# Patient Record
Sex: Female | Born: 1955 | Race: White | Hispanic: No | Marital: Single | Smoking: Current every day smoker
Health system: Southern US, Community
[De-identification: ages and names within clinical notes are randomized; demographics above are authoritative.]

## PROBLEM LIST (undated history)

## (undated) DIAGNOSIS — S069XAA Unspecified intracranial injury with loss of consciousness status unknown, initial encounter: Secondary | ICD-10-CM

## (undated) DIAGNOSIS — S069X9A Unspecified intracranial injury with loss of consciousness of unspecified duration, initial encounter: Secondary | ICD-10-CM

## (undated) DIAGNOSIS — F32A Depression, unspecified: Secondary | ICD-10-CM

## (undated) DIAGNOSIS — F419 Anxiety disorder, unspecified: Secondary | ICD-10-CM

## (undated) DIAGNOSIS — I10 Essential (primary) hypertension: Secondary | ICD-10-CM

## (undated) DIAGNOSIS — H9191 Unspecified hearing loss, right ear: Secondary | ICD-10-CM

## (undated) DIAGNOSIS — H5440 Blindness, one eye, unspecified eye: Secondary | ICD-10-CM

## (undated) DIAGNOSIS — F329 Major depressive disorder, single episode, unspecified: Secondary | ICD-10-CM

---

## 2015-05-11 ENCOUNTER — Emergency Department (HOSPITAL_BASED_OUTPATIENT_CLINIC_OR_DEPARTMENT_OTHER): Payer: 59

## 2015-05-11 ENCOUNTER — Encounter (HOSPITAL_BASED_OUTPATIENT_CLINIC_OR_DEPARTMENT_OTHER): Payer: Self-pay | Admitting: *Deleted

## 2015-05-11 ENCOUNTER — Inpatient Hospital Stay (HOSPITAL_BASED_OUTPATIENT_CLINIC_OR_DEPARTMENT_OTHER)
Admission: EM | Admit: 2015-05-11 | Discharge: 2015-05-15 | DRG: 357 | Disposition: A | Discharge status: Home / Self Care | Payer: 59 | Attending: Internal Medicine | Admitting: Internal Medicine

## 2015-05-11 DIAGNOSIS — R1013 Epigastric pain: Secondary | ICD-10-CM | POA: Diagnosis present

## 2015-05-11 DIAGNOSIS — F419 Anxiety disorder, unspecified: Secondary | ICD-10-CM | POA: Diagnosis present

## 2015-05-11 DIAGNOSIS — F1010 Alcohol abuse, uncomplicated: Secondary | ICD-10-CM | POA: Diagnosis present

## 2015-05-11 DIAGNOSIS — E876 Hypokalemia: Secondary | ICD-10-CM | POA: Diagnosis present

## 2015-05-11 DIAGNOSIS — F17210 Nicotine dependence, cigarettes, uncomplicated: Secondary | ICD-10-CM | POA: Diagnosis present

## 2015-05-11 DIAGNOSIS — Z87820 Personal history of traumatic brain injury: Secondary | ICD-10-CM | POA: Diagnosis not present

## 2015-05-11 DIAGNOSIS — R197 Diarrhea, unspecified: Secondary | ICD-10-CM

## 2015-05-11 DIAGNOSIS — K5790 Diverticulosis of intestine, part unspecified, without perforation or abscess without bleeding: Secondary | ICD-10-CM | POA: Diagnosis present

## 2015-05-11 DIAGNOSIS — E860 Dehydration: Secondary | ICD-10-CM | POA: Insufficient documentation

## 2015-05-11 DIAGNOSIS — F329 Major depressive disorder, single episode, unspecified: Secondary | ICD-10-CM | POA: Diagnosis present

## 2015-05-11 DIAGNOSIS — I10 Essential (primary) hypertension: Secondary | ICD-10-CM | POA: Diagnosis present

## 2015-05-11 DIAGNOSIS — F10239 Alcohol dependence with withdrawal, unspecified: Secondary | ICD-10-CM | POA: Diagnosis present

## 2015-05-11 DIAGNOSIS — F4323 Adjustment disorder with mixed anxiety and depressed mood: Secondary | ICD-10-CM | POA: Diagnosis present

## 2015-05-11 DIAGNOSIS — H5441 Blindness, right eye, normal vision left eye: Secondary | ICD-10-CM | POA: Diagnosis present

## 2015-05-11 DIAGNOSIS — N390 Urinary tract infection, site not specified: Secondary | ICD-10-CM | POA: Diagnosis present

## 2015-05-11 DIAGNOSIS — R000 Tachycardia, unspecified: Secondary | ICD-10-CM | POA: Diagnosis present

## 2015-05-11 DIAGNOSIS — H9191 Unspecified hearing loss, right ear: Secondary | ICD-10-CM | POA: Diagnosis present

## 2015-05-11 DIAGNOSIS — R1110 Vomiting, unspecified: Secondary | ICD-10-CM | POA: Diagnosis not present

## 2015-05-11 DIAGNOSIS — K561 Intussusception: Secondary | ICD-10-CM | POA: Diagnosis present

## 2015-05-11 DIAGNOSIS — F1910 Other psychoactive substance abuse, uncomplicated: Secondary | ICD-10-CM | POA: Diagnosis present

## 2015-05-11 HISTORY — DX: Unspecified intracranial injury with loss of consciousness status unknown, initial encounter: S06.9XAA

## 2015-05-11 HISTORY — DX: Unspecified intracranial injury with loss of consciousness of unspecified duration, initial encounter: S06.9X9A

## 2015-05-11 HISTORY — DX: Anxiety disorder, unspecified: F41.9

## 2015-05-11 HISTORY — DX: Unspecified hearing loss, right ear: H91.91

## 2015-05-11 HISTORY — DX: Depression, unspecified: F32.A

## 2015-05-11 HISTORY — DX: Essential (primary) hypertension: I10

## 2015-05-11 HISTORY — DX: Blindness, one eye, unspecified eye: H54.40

## 2015-05-11 HISTORY — DX: Major depressive disorder, single episode, unspecified: F32.9

## 2015-05-11 LAB — COMPREHENSIVE METABOLIC PANEL
ALT: 31 U/L (ref 14–54)
AST: 47 U/L — ABNORMAL HIGH (ref 15–41)
Albumin: 5.1 g/dL — ABNORMAL HIGH (ref 3.5–5.0)
Alkaline Phosphatase: 89 U/L (ref 38–126)
Anion gap: 16 — ABNORMAL HIGH (ref 5–15)
BUN: 26 mg/dL — ABNORMAL HIGH (ref 6–20)
CHLORIDE: 101 mmol/L (ref 101–111)
CO2: 22 mmol/L (ref 22–32)
CREATININE: 0.78 mg/dL (ref 0.44–1.00)
Calcium: 9.6 mg/dL (ref 8.9–10.3)
Glucose, Bld: 163 mg/dL — ABNORMAL HIGH (ref 65–99)
Potassium: 2.8 mmol/L — ABNORMAL LOW (ref 3.5–5.1)
Sodium: 139 mmol/L (ref 135–145)
TOTAL PROTEIN: 8.3 g/dL — AB (ref 6.5–8.1)
Total Bilirubin: 1.3 mg/dL — ABNORMAL HIGH (ref 0.3–1.2)

## 2015-05-11 LAB — URINALYSIS, ROUTINE W REFLEX MICROSCOPIC
Glucose, UA: 100 mg/dL — AB
Ketones, ur: >80 mg/dL — AB
NITRITE: NEGATIVE
Protein, ur: >300 mg/dL — AB
SPECIFIC GRAVITY, URINE: 1.022 (ref 1.005–1.030)
pH: 6 (ref 5.0–8.0)

## 2015-05-11 LAB — CBC WITH DIFFERENTIAL/PLATELET
Basophils Absolute: 0 10*3/uL (ref 0.0–0.1)
Basophils Relative: 0 %
EOS ABS: 0 10*3/uL (ref 0.0–0.7)
EOS PCT: 0 %
HCT: 51.5 % — ABNORMAL HIGH (ref 36.0–46.0)
HEMOGLOBIN: 17.7 g/dL — AB (ref 12.0–15.0)
LYMPHS ABS: 0.5 10*3/uL — AB (ref 0.7–4.0)
Lymphocytes Relative: 3 %
MCH: 31 pg (ref 26.0–34.0)
MCHC: 34.4 g/dL (ref 30.0–36.0)
MCV: 90.2 fL (ref 78.0–100.0)
MONOS PCT: 8 %
Monocytes Absolute: 1.6 10*3/uL — ABNORMAL HIGH (ref 0.1–1.0)
Neutro Abs: 16.8 10*3/uL — ABNORMAL HIGH (ref 1.7–7.7)
Neutrophils Relative %: 89 %
PLATELETS: 323 10*3/uL (ref 150–400)
RBC: 5.71 MIL/uL — ABNORMAL HIGH (ref 3.87–5.11)
RDW: 14.3 % (ref 11.5–15.5)
WBC: 18.8 10*3/uL — ABNORMAL HIGH (ref 4.0–10.5)

## 2015-05-11 LAB — URINE MICROSCOPIC-ADD ON

## 2015-05-11 LAB — RAPID URINE DRUG SCREEN, HOSP PERFORMED
AMPHETAMINES: NOT DETECTED
BARBITURATES: NOT DETECTED
BENZODIAZEPINES: POSITIVE — AB
COCAINE: NOT DETECTED
Opiates: NOT DETECTED
TETRAHYDROCANNABINOL: POSITIVE — AB

## 2015-05-11 LAB — LIPASE, BLOOD: LIPASE: 25 U/L (ref 11–51)

## 2015-05-11 LAB — ETHANOL: Alcohol, Ethyl (B): <5 mg/dL (ref <5)

## 2015-05-11 MED ORDER — SODIUM CHLORIDE 0.9 % IV SOLN
1000.0000 mL | INTRAVENOUS | Status: DC
  Administered 2015-05-11 – 2015-05-12 (×3): 1000 mL via INTRAVENOUS

## 2015-05-11 MED ORDER — SODIUM CHLORIDE 0.9 % IV SOLN
1000.0000 mL | Freq: Once | INTRAVENOUS | Status: AC
  Administered 2015-05-11: 1000 mL via INTRAVENOUS

## 2015-05-11 MED ORDER — FAMOTIDINE 20 MG PO TABS: 20.0000 mg | ORAL_TABLET | Freq: Once | ORAL | Status: DC

## 2015-05-11 MED ORDER — SODIUM CHLORIDE 0.9 % IV BOLUS (SEPSIS)
1000.0000 mL | Freq: Once | INTRAVENOUS | Status: AC
  Administered 2015-05-11: 1000 mL via INTRAVENOUS

## 2015-05-11 MED ORDER — IOHEXOL 300 MG/ML  SOLN
100.0000 mL | Freq: Once | INTRAMUSCULAR | Status: AC | PRN
  Administered 2015-05-11: 100 mL via INTRAVENOUS

## 2015-05-11 MED ORDER — IOHEXOL 300 MG/ML  SOLN
25.0000 mL | Freq: Once | INTRAMUSCULAR | Status: AC | PRN
  Administered 2015-05-11: 25 mL via ORAL

## 2015-05-11 MED ORDER — POTASSIUM CHLORIDE CRYS ER 20 MEQ PO TBCR
40.0000 meq | EXTENDED_RELEASE_TABLET | Freq: Once | ORAL | Status: AC
  Administered 2015-05-11: 40 meq via ORAL
  Filled 2015-05-11: qty 2

## 2015-05-11 MED ORDER — PROMETHAZINE HCL 25 MG/ML IJ SOLN
25.0000 mg | Freq: Once | INTRAMUSCULAR | Status: AC
  Administered 2015-05-11: 25 mg via INTRAVENOUS
  Filled 2015-05-11: qty 1

## 2015-05-11 MED ORDER — POTASSIUM CHLORIDE 10 MEQ/100ML IV SOLN
10.0000 meq | Freq: Once | INTRAVENOUS | Status: AC
  Administered 2015-05-11: 10 meq via INTRAVENOUS
  Filled 2015-05-11: qty 100

## 2015-05-11 MED ORDER — PIPERACILLIN-TAZOBACTAM 3.375 G IVPB 30 MIN
3.3750 g | Freq: Once | INTRAVENOUS | Status: AC
  Administered 2015-05-11: 3.375 g via INTRAVENOUS
  Filled 2015-05-11 (×2): qty 50

## 2015-05-11 MED ORDER — PANTOPRAZOLE SODIUM 40 MG IV SOLR
40.0000 mg | Freq: Once | INTRAVENOUS | Status: AC
  Administered 2015-05-11: 40 mg via INTRAVENOUS
  Filled 2015-05-11: qty 40

## 2015-05-11 MED ORDER — LORAZEPAM 2 MG/ML IJ SOLN
2.0000 mg | Freq: Once | INTRAMUSCULAR | Status: AC
Start: 2015-05-11 — End: 2015-05-11
  Administered 2015-05-11: 2 mg via INTRAVENOUS
  Filled 2015-05-11: qty 1

## 2015-05-11 MED ORDER — FAMOTIDINE IN NACL 20-0.9 MG/50ML-% IV SOLN
20.0000 mg | Freq: Once | INTRAVENOUS | Status: AC
  Administered 2015-05-11: 20 mg via INTRAVENOUS
  Filled 2015-05-11: qty 50

## 2015-05-11 MED ORDER — ONDANSETRON HCL 4 MG/2ML IJ SOLN
4.0000 mg | Freq: Once | INTRAMUSCULAR | Status: AC
  Administered 2015-05-11: 4 mg via INTRAVENOUS
  Filled 2015-05-11: qty 2

## 2015-05-11 NOTE — ED Notes (Signed)
Pt's son, Melanee Spryan, [phone]

## 2015-05-11 NOTE — ED Notes (Signed)
Pt returned from CT.  Pt is still confused, asking "how did the surgery go."  Reoriented pt and told her she was just at CT.

## 2015-05-11 NOTE — ED Notes (Signed)
Pt's son is at the bedside.  Asked if pt was normally confused, he says that this is her normal state.  She is also normally very restless per patient.  Pt was able to walk to bathroom with little assistance this time as well.

## 2015-05-11 NOTE — ED Notes (Signed)
Pt ambulated to restroom, slightly unsteady on her feet

## 2015-05-11 NOTE — ED Notes (Signed)
Son called to check on pt and nurse asked if pt was usually confused.  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  He states that she is just normally not so weak and that she has been vomiting and not eating regularly recently.

## 2015-05-11 NOTE — ED Notes (Signed)
Pt placed on cardiac monitor 

## 2015-05-11 NOTE — ED Notes (Signed)
Pt caught attempting to hide the potassium pills in her mattress and not take them.  When asked why, she states "because they are too big."  Nurse sitting with pt to be sure she takes her pills and drinks all her contrast.

## 2015-05-11 NOTE — ED Notes (Signed)
Patient transported to CT 

## 2015-05-11 NOTE — ED Provider Notes (Signed)
CSN: [id number]     Arrival date & time 05/11/15  1525 History  By signing my name below, I, Doreatha Martin, attest that this documentation has been prepared under the direction and in the presence of Arby Barrette, MD. Electronically Signed: Doreatha Martin, ED Scribe. 05/11/2015. 6:29 PM.    Chief Complaint  Patient presents with  . Abdominal Pain   The history is provided by the patient. No language interpreter was used.    HPI Comments: Billiejean Schimek is a 59 y.o. female with h/o HTN who presents to the Emergency Department complaining of moderate, intermittent vomiting x4 and loose diarrhea x5 onset yesterday at 4PM with associated epigastric abdominal pain, nausea. Pt also reports that she has some cold symptoms consistent with her usual seasonal allergies. She notes that she frequently has similar symptoms and sees her PCP for treatment. Pt reports that she drinks a few beers a week. No h/o cholecystitis, pancreatitis, cholecystectomy, pancreectomy. No daily medications. She denies fever, leg swelling, back pain.   Past Medical History  Diagnosis Date  . Depressed   . Hypertension   . Anxiety   . TBI (traumatic brain injury) (HCC)   . Deafness in right ear   . Blind right eye    History reviewed. No pertinent past surgical history. History reviewed. No pertinent family history. Social History  Substance Use Topics  . Smoking status: Current Every Day Smoker -- 1.00 packs/day    Types: Cigarettes  . Smokeless tobacco: None  . Alcohol Use: No   OB History    No data available     Review of Systems 10 Systems reviewed and are negative for acute change except as noted in the HPI.   Allergies  Zithromax  Home Medications   Prior to Admission medications   Medication Sig Start Date End Date Taking? Authorizing Provider  ALPRAZolam Prudy Feeler) 1 MG tablet Take 1 mg by mouth at bedtime as needed for anxiety.   Yes Historical Provider, MD  hydrochlorothiazide (HYDRODIURIL) 25 MG  tablet Take 25 mg by mouth daily.   Yes Historical Provider, MD  sertraline (ZOLOFT) 100 MG tablet Take 150 mg by mouth daily.   Yes Historical Provider, MD   BP 132/101 mmHg  Pulse 91  Temp(Src) 99.6 F (37.6 C) (Oral)  Resp 18  Ht  (1.651 m)  Wt 150 lb (68.04 kg)  BMI 24.96 kg/m2  SpO2 94% Physical Exam  Constitutional: She appears well-developed and well-nourished.  Patient is agitated and uncomfortable in appearance. No respiratory distress.  HENT:  Head: Normocephalic and atraumatic.  Mouth/Throat: Oropharynx is clear and moist.  Eyes: Conjunctivae and EOM are normal. Pupils are equal, round, and reactive to light.  Neck: Normal range of motion. Neck supple.  Cardiovascular: Normal rate, regular rhythm, normal heart sounds and intact distal pulses.   Pulmonary/Chest: Effort normal and breath sounds normal. No respiratory distress. She has no wheezes. She has no rales.  Abdominal: Soft. She exhibits no distension. There is tenderness.  Epigastric pain to palpation.  Musculoskeletal: Normal range of motion. She exhibits no edema or tenderness.  Neurological: She is alert. No cranial nerve deficit. Coordination normal.  Skin: Skin is warm and dry.  Psychiatric:  Patient is moderately agitated.  Nursing note and vitals reviewed.   ED Course  Procedures (including critical care time) DIAGNOSTIC STUDIES: Oxygen Saturation is 99% on RA, normal by my interpretation.    COORDINATION OF CARE: 6:27 PM Discussed treatment plan with pt at bedside  which includes lab work and pt agreed to plan.   Labs Review Labs Reviewed  URINALYSIS, ROUTINE W REFLEX MICROSCOPIC (NOT AT [facility]) - Abnormal; Notable for the following:    APPearance CLOUDY (*)    Glucose, UA 100 (*)    Hgb urine dipstick LARGE (*)    Bilirubin Urine SMALL (*)    Ketones, ur >80 (*)    Protein, ur >300 (*)    Leukocytes, UA SMALL (*)    All other components within normal limits  CBC WITH DIFFERENTIAL/PLATELET  - Abnormal; Notable for the following:    WBC 18.8 (*)    RBC 5.71 (*)    Hemoglobin 17.7 (*)    HCT 51.5 (*)    Neutro Abs 16.8 (*)    Lymphs Abs 0.5 (*)    Monocytes Absolute 1.6 (*)    All other components within normal limits  COMPREHENSIVE METABOLIC PANEL - Abnormal; Notable for the following:    Potassium 2.8 (*)    Glucose, Bld 163 (*)    BUN 26 (*)    Total Protein 8.3 (*)    Albumin 5.1 (*)    AST 47 (*)    Total Bilirubin 1.3 (*)    Anion gap 16 (*)    All other components within normal limits  URINE MICROSCOPIC-ADD ON - Abnormal; Notable for the following:    Squamous Epithelial / LPF 0-5 (*)    Bacteria, UA MANY (*)    Casts HYALINE CASTS (*)    All other components within normal limits  URINE RAPID DRUG SCREEN, HOSP PERFORMED - Abnormal; Notable for the following:    Benzodiazepines POSITIVE (*)    Tetrahydrocannabinol POSITIVE (*)    All other components within normal limits  LIPASE, BLOOD  ETHANOL   Imaging Review Ct Head Wo Contrast  05/11/2015  CLINICAL DATA:  Confusion and weakness EXAM: CT HEAD WITHOUT CONTRAST TECHNIQUE: Contiguous axial images were obtained from the base of the skull through the vertex without intravenous contrast. COMPARISON:  None. FINDINGS: The examination is significantly limited by patient motion artifact. The bony calvarium appears intact. No soft tissue changes are noted. No findings to suggest acute hemorrhage, acute infarction or space-occupying mass lesion are noted. IMPRESSION: Somewhat limited examination although no acute abnormality is noted. Electronically Signed   By: Alcide CleverMark  Lukens M.D.   On: 05/11/2015 22:28   Ct Abdomen Pelvis W Contrast  05/11/2015  CLINICAL DATA:  59 year old female with nausea vomiting and periumbilical pain. EXAM: CT ABDOMEN AND PELVIS WITH CONTRAST TECHNIQUE: Multidetector CT imaging of the abdomen and pelvis was performed using the standard protocol following bolus administration of intravenous  contrast. CONTRAST:  25mL OMNIPAQUE IOHEXOL 300 MG/ML SOLN, 100mL OMNIPAQUE IOHEXOL 300 MG/ML SOLN COMPARISON:  None. FINDINGS: Minimal bibasilar dependent atelectatic changes. The lung bases are otherwise clear. No intra-abdominal free air or free fluid. The liver, gallbladder, pancreas, and spleen appear unremarkable. There are bilateral adrenal thickening and nodularity measuring up to 1.6 cm on the right. MRI may provide better evaluation. 1 cm bilateral renal hypodense lesions are incompletely characterized but likely represent. There is no hydronephrosis on either side. The visualized ureters and urinary bladder appear unremarkable. The uterus and the ovaries appear grossly unremarkable. There is approximately cm telescoping of a loop of small bowel within the adjacent small bowel loop in the left lower abdomen compatible with intussusception. Although this may be a transient intussusception the length of the intussusception is concerning for an underlying lead point.  Clinical correlation and follow-up with MR enterography recommended. There is mild thickening and edema of the bowel at the intussusception. No pneumatosis identified. There is extensive sigmoid diverticulosis with muscular hypertrophy. Minimal perisigmoid haziness likely represents chronic inflammation and scarring. A mild/early sigmoid diverticulitis is less likely but not excluded. Clinical correlation is recommended. There is no evidence of bowel obstruction. Normal appendix. A small hiatal hernia is noted. There is a 2.7 cm duodenal diverticulum. There is mild aortoiliac atherosclerotic disease. The abdominal aorta and IVC appear patent. The origins of the celiac axis, SMA, IMA as well as the origins of the renal arteries are patent. There is a retro aortic left renal vein. No portal venous gas identified. There is no adenopathy. The abdominal wall soft tissues appear unremarkable. There is degenerative changes of the spine. Bilateral L5  pars defects with grade 1 L5-S1 anterolisthesis. No acute fracture. IMPRESSION: A 6 cm small bowel intussusception in the left hemiabdomen with mild edema of the bowel. No pneumatosis or evidence of obstruction. Follow-up with MR enterography recommended to exclude underlying lead point. Extensive sigmoid diverticulosis with muscular hypertrophy. A mild/early diverticulitis is less likely. Clinical correlation is recommended. No evidence of bowel obstruction. Normal appendix. Electronically Signed   By: Elgie Collard M.D.   On: 05/11/2015 22:21    I have personally reviewed and evaluated these images and lab results as part of my medical decision-making. Consult: Dr. Maisie Fus general surgery. She advises that this time that intussusception is not an acute surgical issue if there are no signs of obstruction. This can happen with gastroenteritis. Patient can be admitted to hospitalist with consultation with surgery if signs obstruction develop Consult the hospitalist. Patient be admitted for hydration, pain control with observation for any signs of obstruction or surgical abdomen. MDM   Final diagnoses:  Vomiting and diarrhea  Dehydration  Epigastric pain  Intussusception [facility])   Patient presented with epigastric pain, vomiting and diarrhea. First consideration was for gastroenteritis however patient also complained of significant epigastric pain with leukocytosis. CT scan was obtained and intussusception was identified. This was reviewed with general surgery as outlined. No emergent surgical intervention required as discussed with Dr. Maisie Fus. Patient showed signs of dehydration with hemoconcentration. Patient's demeanor has a degree of agitation and guardedness suggestive of possible substance or psychiatric component. Per the patient's son this is a baseline for her. Patient reported history of traumatic brain injury, distant. I question whether or not the patient had significant alcohol consumption.  She did endorse weekly use but did not endorse heavy regular use or history of withdrawal. Patient was empirically given Ativan. Drug screen is also positive for benzodiazepine and marijuana. Upon recheck the patient appears much improved. She is now relaxed. She is alert and appropriate. She will be admitted for ongoing treatment of her hypokalemia with vomiting and diarrhea and observation for the identified intussusception.  Arby Barrette, MD 05/11/15 [id number]

## 2015-05-11 NOTE — Progress Notes (Signed)
[facility] Dr. Donnald GarrePfeiffer  59 year old lady with past medical history of traumatic brain injury with right ear deaf and right eye blindness, hypertension, depression, anxiety, drug abuse, who presents with nausea, vomiting and abdominal pain for 2 days. CT abdomen/pelvis showed 6 cm of small bowel intussusception and diverticulosis.   In emergency room, patient was found to have WBC 18.8, temperature 99.6, mild tachycardia, potassium 2.8, renal functioning okay, positive urinalysis with small amount of leukocyte, alcohol level less than 5, lipase 25, positive UDS for THC and benzos, abnormal liver function with AST 47, ALT 31, total bilirubin 1.3 and ALP 89. CT head is negative for acute temporal (though the study was limited). EDP discussed with general surgeon, Dr. Maisie Fushomas who did not think patient needs surgery. Pt is accepted to tele bed.  Ana HarpXilin Aymara Sassi, MD  [facility] Pager [phone]  If 7PM-7AM, please contact night-coverage [email or web address] Password [email or web address] 05/11/2015, 11:36 PM

## 2015-05-11 NOTE — ED Notes (Signed)
Pt c/o adb pain n/v/d x 2 days

## 2015-05-12 ENCOUNTER — Inpatient Hospital Stay (HOSPITAL_COMMUNITY): Payer: 59

## 2015-05-12 LAB — CBC
HEMATOCRIT: 48 % — AB (ref 36.0–46.0)
Hemoglobin: 16.4 g/dL — ABNORMAL HIGH (ref 12.0–15.0)
MCH: 31.7 pg (ref 26.0–34.0)
MCHC: 34.2 g/dL (ref 30.0–36.0)
MCV: 92.8 fL (ref 78.0–100.0)
Platelets: 275 10*3/uL (ref 150–400)
RBC: 5.17 MIL/uL — ABNORMAL HIGH (ref 3.87–5.11)
RDW: 14.3 % (ref 11.5–15.5)
WBC: 11.3 10*3/uL — AB (ref 4.0–10.5)

## 2015-05-12 LAB — BASIC METABOLIC PANEL
ANION GAP: 13 (ref 5–15)
BUN: 11 mg/dL (ref 6–20)
CHLORIDE: 105 mmol/L (ref 101–111)
CO2: 20 mmol/L — AB (ref 22–32)
Calcium: 8.8 mg/dL — ABNORMAL LOW (ref 8.9–10.3)
Creatinine, Ser: 0.67 mg/dL (ref 0.44–1.00)
GFR calc Af Amer: >60 mL/min (ref >60)
GFR calc non Af Amer: >60 mL/min (ref >60)
GLUCOSE: 109 mg/dL — AB (ref 65–99)
POTASSIUM: 3.4 mmol/L — AB (ref 3.5–5.1)
Sodium: 138 mmol/L (ref 135–145)

## 2015-05-12 LAB — MAGNESIUM: Magnesium: 1.9 mg/dL (ref 1.7–2.4)

## 2015-05-12 MED ORDER — SODIUM CHLORIDE 0.9 % IV SOLN
INTRAVENOUS | Status: AC
  Administered 2015-05-12: 02:00:00 via INTRAVENOUS

## 2015-05-12 MED ORDER — ALPRAZOLAM 0.5 MG PO TABS
1.0000 mg | ORAL_TABLET | Freq: Once | ORAL | Status: AC | PRN
  Administered 2015-05-12: 1 mg via ORAL
  Filled 2015-05-12: qty 2

## 2015-05-12 MED ORDER — WHITE PETROLATUM GEL
Status: AC
  Administered 2015-05-12: 23:00:00
  Filled 2015-05-12: qty 1

## 2015-05-12 MED ORDER — POTASSIUM CHLORIDE 10 MEQ/100ML IV SOLN
10.0000 meq | Freq: Once | INTRAVENOUS | Status: AC
  Administered 2015-05-12: 10 meq via INTRAVENOUS
  Filled 2015-05-12: qty 100

## 2015-05-12 MED ORDER — SODIUM CHLORIDE 0.9 % IV SOLN
1000.0000 mL | INTRAVENOUS | Status: DC
  Administered 2015-05-13: 1000 mL via INTRAVENOUS

## 2015-05-12 MED ORDER — BARIUM SULFATE 0.1 % PO SUSP
ORAL | Status: AC
  Filled 2015-05-12: qty 3

## 2015-05-12 MED ORDER — SERTRALINE HCL 50 MG PO TABS
150.0000 mg | ORAL_TABLET | Freq: Every day | ORAL | Status: DC
  Administered 2015-05-12 – 2015-05-13 (×2): 150 mg via ORAL
  Filled 2015-05-12 (×3): qty 1

## 2015-05-12 MED ORDER — GADOBENATE DIMEGLUMINE 529 MG/ML IV SOLN
15.0000 mL | Freq: Once | INTRAVENOUS | Status: AC | PRN
Start: 2015-05-12 — End: 2015-05-12
  Administered 2015-05-12: 15 mL via INTRAVENOUS

## 2015-05-12 MED ORDER — ALPRAZOLAM 0.5 MG PO TABS
1.0000 mg | ORAL_TABLET | Freq: Every evening | ORAL | Status: DC | PRN
  Administered 2015-05-12 – 2015-05-14 (×3): 1 mg via ORAL
  Filled 2015-05-12 (×3): qty 2

## 2015-05-12 MED ORDER — PROMETHAZINE HCL 25 MG PO TABS
12.5000 mg | ORAL_TABLET | Freq: Four times a day (QID) | ORAL | Status: DC | PRN
  Administered 2015-05-12 – 2015-05-15 (×9): 12.5 mg via ORAL
  Filled 2015-05-12 (×9): qty 1

## 2015-05-12 MED ORDER — HEPARIN SODIUM (PORCINE) 5000 UNIT/ML IJ SOLN
5000.0000 [IU] | Freq: Three times a day (TID) | INTRAMUSCULAR | Status: DC
  Administered 2015-05-12 – 2015-05-15 (×7): 5000 [IU] via SUBCUTANEOUS
  Filled 2015-05-12 (×5): qty 1

## 2015-05-12 NOTE — H&P (Signed)
[facility] History and Physical  Ana SchwartzKathy Kent NWG:[id number] DOB: 08/24/1955 DOA: 05/11/2015  Referring physician: EDP PCP: No primary care provider on file.   Chief Complaint: Abdominal pain   HPI: Ana Stone is a 59 y.o. female who presents to the ED with c/o moderate, intermittent vomiting x4 episodes and diarrhea x5 episodes onset yesterday at 4pm.  Patient also reports that she has similar symptoms to above.  She has tried nothing for these symptoms and [facility]  Patient was demonstrating severe signs of agitation in the ED which improved with 1mg  ativan.  Although the EDP was more suspicious of a substance or psychatric component to her presentation, her CT abd/pelvis actually came back showing 6cm intussusception.  [facility] reports no further abdominal pain and is feeling fine, she asks for phenergan, but states that this is for sleep.  Review of Systems: Systems reviewed.  As above, otherwise negative  Past Medical History  Diagnosis Date  . Depressed   . Hypertension   . Anxiety   . TBI (traumatic brain injury) (HCC)   . Deafness in right ear   . Blind right eye    History reviewed. No pertinent past surgical history. Social History:  reports that she has been smoking Cigarettes.  She has been smoking about 1.00 pack per day. She does not have any smokeless tobacco history on file. She reports that she does not drink alcohol. Her drug history is not on file.  Allergies  Allergen Reactions  . Zithromax [Azithromycin]     History reviewed. No pertinent family history.   Prior to Admission medications   Medication Sig Start Date End Date Taking? Authorizing Provider  ALPRAZolam Prudy Feeler(XANAX) 1 MG tablet Take 1 mg by mouth at bedtime as needed for anxiety.   Yes Historical Provider, MD  hydrochlorothiazide (HYDRODIURIL) 25 MG tablet Take 25 mg by mouth daily.   Yes Historical Provider, MD  sertraline (ZOLOFT) 100 MG tablet Take  150 mg by mouth daily.   Yes Historical Provider, MD   Physical Exam: Filed Vitals:   05/12/15 0024 05/12/15 0127  BP: 156/92 137/80  Pulse: 84 81  Temp: 98.7 F (37.1 C) 98.6 F (37 C)  Resp: 20 19    BP 137/80 mmHg  Pulse 81  Temp(Src) 98.6 F (37 C) (Oral)  Resp 19  Ht 5\' 3"  (1.6 m)  Wt 70.2 kg (154 lb 12.2 oz)  BMI 27.42 kg/m2  SpO2 93%  General Appearance:    Alert, oriented, no distress, appears stated age  Head:    Normocephalic, atraumatic  Eyes:    PERRL, EOMI, sclera non-icteric        Nose:   Nares without drainage or epistaxis. Mucosa, turbinates normal  Throat:   Moist mucous membranes. Oropharynx without erythema or exudate.  Neck:   Supple. No carotid bruits.  No thyromegaly.  No lymphadenopathy.   Back:     No CVA tenderness, no spinal tenderness  Lungs:     Clear to auscultation bilaterally, without wheezes, rhonchi or rales  Chest wall:    No tenderness to palpitation  Heart:    Regular rate and rhythm without murmurs, gallops, rubs  Abdomen:     Soft, non-tender, nondistended, normal bowel sounds, no organomegaly  Genitalia:    deferred  Rectal:    deferred  Extremities:   No clubbing, cyanosis or edema.  Pulses:   2+ and symmetric all extremities  Skin:  Skin color, texture, turgor normal, no rashes or lesions  Lymph nodes:   Cervical, supraclavicular, and axillary nodes normal  Neurologic:   CNII-XII intact. Normal strength, sensation and reflexes      throughout    Labs on Admission:  Basic Metabolic Panel:  Recent Labs Lab 05/11/15 1621  NA 139  K 2.8*  CL 101  CO2 22  GLUCOSE 163*  BUN 26*  CREATININE 0.78  CALCIUM 9.6   Liver Function Tests:  Recent Labs Lab 05/11/15 1621  AST 47*  ALT 31  ALKPHOS 89  BILITOT 1.3*  PROT 8.3*  ALBUMIN 5.1*    Recent Labs Lab 05/11/15 1621  LIPASE 25   No results for input(s): AMMONIA in the last 168 hours. CBC:  Recent Labs Lab 05/11/15 1621  WBC 18.8*  NEUTROABS 16.8*  HGB  17.7*  HCT 51.5*  MCV 90.2  PLT 323   Cardiac Enzymes: No results for input(s): CKTOTAL, CKMB, CKMBINDEX, TROPONINI in the last 168 hours.  BNP (last 3 results) No results for input(s): PROBNP in the last 8760 hours. CBG: No results for input(s): GLUCAP in the last 168 hours.  Radiological Exams on Admission: Ct Head Wo Contrast  05/11/2015  CLINICAL DATA:  Confusion and weakness EXAM: CT HEAD WITHOUT CONTRAST TECHNIQUE: Contiguous axial images were obtained from the base of the skull through the vertex without intravenous contrast. COMPARISON:  None. FINDINGS: The examination is significantly limited by patient motion artifact. The bony calvarium appears intact. No soft tissue changes are noted. No findings to suggest acute hemorrhage, acute infarction or space-occupying mass lesion are noted. IMPRESSION: Somewhat limited examination although no acute abnormality is noted. Electronically Signed   By: Alcide Clever M.D.   On: 05/11/2015 22:28   Ct Abdomen Pelvis W Contrast  05/11/2015  CLINICAL DATA:  59 year old female with nausea vomiting and periumbilical pain. EXAM: CT ABDOMEN AND PELVIS WITH CONTRAST TECHNIQUE: Multidetector CT imaging of the abdomen and pelvis was performed using the standard protocol following bolus administration of intravenous contrast. CONTRAST:  25mL OMNIPAQUE IOHEXOL 300 MG/ML SOLN, OMNIPAQUE IOHEXOL 300 MG/ML SOLN COMPARISON:  None. FINDINGS: Minimal bibasilar dependent atelectatic changes. The lung bases are otherwise clear. No intra-abdominal free air or free fluid. The liver, gallbladder, pancreas, and spleen appear unremarkable. There are bilateral adrenal thickening and nodularity measuring up to 1.6 cm on the right. MRI may provide better evaluation. 1 cm bilateral renal hypodense lesions are incompletely characterized but likely represent. There is no hydronephrosis on either side. The visualized ureters and urinary bladder appear unremarkable. The uterus  and the ovaries appear grossly unremarkable. There is approximately cm telescoping of a loop of small bowel within the adjacent small bowel loop in the left lower abdomen compatible with intussusception. Although this may be a transient intussusception the length of the intussusception is concerning for an underlying lead point. Clinical correlation and follow-up with MR enterography recommended. There is mild thickening and edema of the bowel at the intussusception. No pneumatosis identified. There is extensive sigmoid diverticulosis with muscular hypertrophy. Minimal perisigmoid haziness likely represents chronic inflammation and scarring. A mild/early sigmoid diverticulitis is less likely but not excluded. Clinical correlation is recommended. There is no evidence of bowel obstruction. Normal appendix. A small hiatal hernia is noted. There is a 2.7 cm duodenal diverticulum. There is mild aortoiliac atherosclerotic disease. The abdominal aorta and IVC appear patent. The origins of the celiac axis, SMA, IMA as well as the origins of the renal arteries  are patent. There is a retro aortic left renal vein. No portal venous gas identified. There is no adenopathy. The abdominal wall soft tissues appear unremarkable. There is degenerative changes of the spine. 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 No acute fracture. IMPRESSION: A 6 cm small bowel intussusception in the left hemiabdomen with mild edema of the bowel. No pneumatosis or evidence of obstruction. Follow-up with MR enterography recommended to exclude underlying lead point. Extensive sigmoid diverticulosis with muscular hypertrophy. A mild/early diverticulitis is less likely. Clinical correlation is recommended. No evidence of bowel obstruction. Normal appendix. Electronically Signed   By: Elgie Collard M.D.   On: 05/11/2015 22:21    EKG: Independently reviewed.  Assessment/Plan Principal Problem:   Intussusception  (HCC)   1. Intussusception - 1. MR enterography ordered to look for lead point as there is high risk of malignancy associated with this 2. Symptomatically appears resolved, now just asking for phenergan "for sleep" 3. Will try clear liquid diet starting tomorrow 4. IVF for now    Code Status: Full  Family Communication: No family in room Disposition Plan: admit to inpatient   Time spent: 50 min  Christionna Poland M. [facility] Pager [phone]  If 7AM-7PM, please contact the day team taking care of the patient [email or web address] Password [email or web address] 05/12/2015, 2:01 AM

## 2015-05-12 NOTE — Consult Note (Signed)
Reason for Consult:abd pain Referring Physician: Dr Sharol Given Ana Stone is an 59 y.o. female.  HPI: 40 yof with prior history of same that she says occurs intermittently. She has had loose stools and emesis since yesterday associated with "burning" in her abdomen.  She denies fevers.  No prior surgical history she endorses. She underwent ct scan that shows a 6 cm area of small bowel intussusception. She is passing flatus still.     Past Medical History  Diagnosis Date  . Depressed   . Hypertension   . Anxiety   . TBI (traumatic brain injury) ([place])   . Deafness in right ear   . Blind right eye     History reviewed. No pertinent past surgical history.  History reviewed. No pertinent family history.  Social History:  reports that she has been smoking Cigarettes.  She has been smoking about 1.00 pack per day. She does not have any smokeless tobacco history on file. She reports that she does not drink alcohol. Her drug history is not on file.  Allergies:  Allergies  Allergen Reactions  . Zithromax [Azithromycin] Rash    Medications: I have reviewed the patient's current medications.  Results for orders placed or performed during the hospital encounter of 05/11/15 (from the past 48 hour(s))  Urinalysis, Routine w reflex microscopic (not at [facility])     Status: Abnormal   Collection Time: 05/11/15  3:32 PM  Result Value Ref Range   Color, Urine YELLOW YELLOW   APPearance CLOUDY (A) CLEAR   Specific Gravity, Urine 1.022 1.005 - 1.030   pH 6.0 5.0 - 8.0   Glucose, UA 100 (A) NEGATIVE mg/dL   Hgb urine dipstick LARGE (A) NEGATIVE   Bilirubin Urine SMALL (A) NEGATIVE   Ketones, ur >80 (A) NEGATIVE mg/dL   Protein, ur >300 (A) NEGATIVE mg/dL   Nitrite NEGATIVE NEGATIVE   Leukocytes, UA SMALL (A) NEGATIVE  Urine microscopic-add on     Status: Abnormal   Collection Time: 05/11/15  3:32 PM  Result Value Ref Range   Squamous Epithelial / LPF 0-5 (A) NONE SEEN   WBC, UA 6-30 0 - 5  WBC/hpf   RBC / HPF 0-5 0 - 5 RBC/hpf   Bacteria, UA MANY (A) NONE SEEN   Casts HYALINE CASTS (A) NEGATIVE    Comment: WBC CAST   Urine-Other MUCOUS PRESENT     Comment: YEAST PRESENT  Urine rapid drug screen (hosp performed)     Status: Abnormal   Collection Time: 05/11/15  3:32 PM  Result Value Ref Range   Opiates NONE DETECTED NONE DETECTED   Cocaine NONE DETECTED NONE DETECTED   Benzodiazepines POSITIVE (A) NONE DETECTED   Amphetamines NONE DETECTED NONE DETECTED   Tetrahydrocannabinol POSITIVE (A) NONE DETECTED   Barbiturates NONE DETECTED NONE DETECTED    Comment:        DRUG SCREEN FOR MEDICAL PURPOSES ONLY.  IF CONFIRMATION IS NEEDED FOR ANY PURPOSE, NOTIFY LAB WITHIN 5 DAYS.        LOWEST DETECTABLE LIMITS FOR URINE DRUG SCREEN Drug Class       Cutoff (ng/mL) Amphetamine      1000 Barbiturate      200 Benzodiazepine   924 Tricyclics       268 Opiates          300 Cocaine          300 THC              50  CBC with Differential     Status: Abnormal   Collection Time: 05/11/15  4:21 PM  Result Value Ref Range   WBC 18.8 (H) 4.0 - 10.5 K/uL   RBC 5.71 (H) 3.87 - 5.11 MIL/uL   Hemoglobin 17.7 (H) 12.0 - 15.0 g/dL   HCT 51.5 (H) 36.0 - 46.0 %   MCV 90.2 78.0 - 100.0 fL   MCH 31.0 26.0 - 34.0 pg   MCHC 34.4 30.0 - 36.0 g/dL   RDW 14.3 11.5 - 15.5 %   Platelets 323 150 - 400 K/uL   Neutrophils Relative % 89 %   Neutro Abs 16.8 (H) 1.7 - 7.7 K/uL   Lymphocytes Relative 3 %   Lymphs Abs 0.5 (L) 0.7 - 4.0 K/uL   Monocytes Relative 8 %   Monocytes Absolute 1.6 (H) 0.1 - 1.0 K/uL   Eosinophils Relative 0 %   Eosinophils Absolute 0.0 0.0 - 0.7 K/uL   Basophils Relative 0 %   Basophils Absolute 0.0 0.0 - 0.1 K/uL  Lipase, blood     Status: None   Collection Time: 05/11/15  4:21 PM  Result Value Ref Range   Lipase 25 11 - 51 U/L  Comprehensive metabolic panel     Status: Abnormal   Collection Time: 05/11/15  4:21 PM  Result Value Ref Range   Sodium 139 135 -  145 mmol/L   Potassium 2.8 (L) 3.5 - 5.1 mmol/L   Chloride 101 101 - 111 mmol/L   CO2 22 22 - 32 mmol/L   Glucose, Bld 163 (H) 65 - 99 mg/dL   BUN 26 (H) 6 - 20 mg/dL   Creatinine, Ser 0.78 0.44 - 1.00 mg/dL   Calcium 9.6 8.9 - 10.3 mg/dL   Total Protein 8.3 (H) 6.5 - 8.1 g/dL   Albumin 5.1 (H) 3.5 - 5.0 g/dL   AST 47 (H) 15 - 41 U/L   ALT 31 14 - 54 U/L   Alkaline Phosphatase 89 38 - 126 U/L   Total Bilirubin 1.3 (H) 0.3 - 1.2 mg/dL   GFR calc non Af Amer >60 >60 mL/min   GFR calc Af Amer >60 >60 mL/min    Comment: (NOTE) The eGFR has been calculated using the CKD EPI equation. This calculation has not been validated in all clinical situations. eGFR's persistently <60 mL/min signify possible Chronic Kidney Disease.    Anion gap 16 (H) 5 - 15  Ethanol     Status: None   Collection Time: 05/11/15  4:21 PM  Result Value Ref Range   Alcohol, Ethyl (B) <5 <5 mg/dL    Comment:        LOWEST DETECTABLE LIMIT FOR SERUM ALCOHOL IS 5 mg/dL FOR MEDICAL PURPOSES ONLY   CBC     Status: Abnormal   Collection Time: 05/12/15  9:55 AM  Result Value Ref Range   WBC 11.3 (H) 4.0 - 10.5 K/uL   RBC 5.17 (H) 3.87 - 5.11 MIL/uL   Hemoglobin 16.4 (H) 12.0 - 15.0 g/dL   HCT 48.0 (H) 36.0 - 46.0 %   MCV 92.8 78.0 - 100.0 fL   MCH 31.7 26.0 - 34.0 pg   MCHC 34.2 30.0 - 36.0 g/dL   RDW 14.3 11.5 - 15.5 %   Platelets 275 150 - 400 K/uL  Basic metabolic panel     Status: Abnormal   Collection Time: 05/12/15  9:55 AM  Result Value Ref Range   Sodium 138 135 - 145 mmol/L  Potassium 3.4 (L) 3.5 - 5.1 mmol/L   Chloride 105 101 - 111 mmol/L   CO2 20 (L) 22 - 32 mmol/L   Glucose, Bld 109 (H) 65 - 99 mg/dL   BUN 11 6 - 20 mg/dL   Creatinine, Ser 0.67 0.44 - 1.00 mg/dL   Calcium 8.8 (L) 8.9 - 10.3 mg/dL   GFR calc non Af Amer >60 >60 mL/min   GFR calc Af Amer >60 >60 mL/min    Comment: (NOTE) The eGFR has been calculated using the CKD EPI equation. This calculation has not been validated in  all clinical situations. eGFR's persistently <60 mL/min signify possible Chronic Kidney Disease.    Anion gap 13 5 - 15    Ct Head Wo Contrast  05/11/2015  CLINICAL DATA:  Confusion and weakness EXAM: CT HEAD WITHOUT CONTRAST TECHNIQUE: Contiguous axial images were obtained from the base of the skull through the vertex without intravenous contrast. COMPARISON:  None. FINDINGS: The examination is significantly limited by patient motion artifact. The bony calvarium appears intact. No soft tissue changes are noted. No findings to suggest acute hemorrhage, acute infarction or space-occupying mass lesion are noted. IMPRESSION: Somewhat limited examination although no acute abnormality is noted. Electronically Signed   By: Inez Catalina M.D.   On: 05/11/2015 22:28   Ct Abdomen Pelvis W Contrast  05/11/2015  CLINICAL DATA:  59 year old female with nausea vomiting and periumbilical pain. EXAM: CT ABDOMEN AND PELVIS WITH CONTRAST TECHNIQUE: Multidetector CT imaging of the abdomen and pelvis was performed using the standard protocol following bolus administration of intravenous contrast. CONTRAST:  45m OMNIPAQUE IOHEXOL 300 MG/ML SOLN, 1042mOMNIPAQUE IOHEXOL 300 MG/ML SOLN COMPARISON:  None. FINDINGS: Minimal bibasilar dependent atelectatic changes. The lung bases are otherwise clear. No intra-abdominal free air or free fluid. The liver, gallbladder, pancreas, and spleen appear unremarkable. There are bilateral adrenal thickening and nodularity measuring up to 1.6 cm on the right. MRI may provide better evaluation. 1 cm bilateral renal hypodense lesions are incompletely characterized but likely represent. There is no hydronephrosis on either side. The visualized ureters and urinary bladder appear unremarkable. The uterus and the ovaries appear grossly unremarkable. There is approximately cm telescoping of a loop of small bowel within the adjacent small bowel loop in the left lower abdomen compatible with  intussusception. Although this may be a transient intussusception the length of the intussusception is concerning for an underlying lead point. Clinical correlation and follow-up with MR enterography recommended. There is mild thickening and edema of the bowel at the intussusception. No pneumatosis identified. There is extensive sigmoid diverticulosis with muscular hypertrophy. Minimal perisigmoid haziness likely represents chronic inflammation and scarring. A mild/early sigmoid diverticulitis is less likely but not excluded. Clinical correlation is recommended. There is no evidence of bowel obstruction. Normal appendix. A small hiatal hernia is noted. There is a 2.7 cm duodenal diverticulum. There is mild aortoiliac atherosclerotic disease. The abdominal aorta and IVC appear patent. The origins of the celiac axis, SMA, IMA as well as the origins of the renal arteries are patent. There is a retro aortic left renal vein. No portal venous gas identified. There is no adenopathy. The abdominal wall soft tissues appear unremarkable. There is degenerative changes of the spine. 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 No acute fracture. IMPRESSION: A 6 cm small bowel intussusception in the left hemiabdomen with mild edema of the bowel. No pneumatosis or evidence of obstruction. Follow-up with MR enterography recommended to exclude underlying lead  point. Extensive sigmoid diverticulosis with muscular hypertrophy. A mild/early diverticulitis is less likely. Clinical correlation is recommended. No evidence of bowel obstruction. Normal appendix. Electronically Signed   By: Anner Crete M.D.   On: 05/11/2015 22:21    Review of Systems  Constitutional: Negative for fever and chills.  Respiratory: Negative for shortness of breath.   Cardiovascular: Negative for chest pain.  Gastrointestinal: Positive for nausea, vomiting, abdominal pain and diarrhea. Negative for blood in stool.   Blood pressure  137/65, pulse 67, temperature 98.6 F (37 C), temperature source Oral, resp. rate 20, height _0  (1.6 m), weight 70.2 kg (154 lb 12.2 oz), SpO2 94 %. Physical Exam  Vitals reviewed. Constitutional: She is oriented to person, place, and time. She appears well-developed and well-nourished.  HENT:  Head: Normocephalic and atraumatic.  Eyes: No scleral icterus.  Neck: Neck supple.  Cardiovascular: Normal rate, regular rhythm and normal heart sounds.   Respiratory: Effort normal and breath sounds normal. She has no wheezes. She has no rales.  GI: Soft. She exhibits no distension. Tenderness: minimally tender perumbilica. There is no rebound and no guarding.  Lymphadenopathy:    She has no cervical adenopathy.  Neurological: She is alert and oriented to person, place, and time.    Assessment/Plan: abd pain  Not sure if ct finding needs to be addressed surgically. This could be benign finding on scanner but length may mean something more. She is not obstructed currently and does not have surgical abdomen. She states this same process has occurred before. I think proceeding with mr enterography reasonable   Kenney Going 05/12/2015, 4:03 PM

## 2015-05-12 NOTE — Progress Notes (Signed)
Nutrition Brief Note  Patient identified on the Malnutrition Screening Tool (MST) Report  Wt Readings from Last 15 Encounters:  05/12/15 154 lb 12.2 oz (70.2 kg)   Ana SchwartzKathy Stone is a 59 y.o. female who presents to the ED with c/o moderate, intermittent vomiting x4 episodes and diarrhea x5 episodes onset yesterday at 4pm. Patient also reports that she has similar symptoms to above. She has tried nothing for these symptoms and [facility]. Patient was demonstrating severe signs of agitation in the ED which improved with 1mg  ativan. Although the EDP was more suspicious of a substance or psychatric component to her presentation, her CT abd/pelvis actually came back showing 6cm intussusception.  Pt admitted with intussusception.   Spoke with pt, who reports appetite has been poor only for a day, due to nausea, vomiting, and diarrhea. Prior to this, pt reports good appetite. She is requesting popsicles and applesauce.   Pt reports some weight loss, but relates this to poor po's from nausea and vomiting.   Nutrition-Focused physical exam completed. Findings are no fat depletion, no muscle depletion, and no edema.   Body mass index is 27.42 kg/(m^2). Patient meets criteria for overweight based on current BMI.   Current diet order is clear liquid, patient is consuming approximately n/a% of meals at this time. Labs and medications reviewed.   No nutrition interventions warranted at this time. If nutrition issues arise, please consult RD.   Tavyn Kurka A. Mayford KnifeWilliams, RD, LDN, CDE Pager: [phone] After hours Pager: [phone]

## 2015-05-12 NOTE — ED Notes (Signed)
Pt transferred to [place] via [facility] with belongings. 

## 2015-05-12 NOTE — Progress Notes (Signed)
Subjective: Patient admitted this morning, see detailed H&P by Dr Julian ReilGardner 59 year old lady with past medical history of traumatic brain injury with right ear deaf and right eye blindness, hypertension, depression, anxiety, drug abuse, who presents with nausea, vomiting and abdominal pain for 2 days. CT abdomen/pelvis showed 6 cm of small bowel intussusception and diverticulosis.EDP discussed with general surgeon, Dr. Maisie Fushomas who did not think patient needs surgery.  This morning patient denies any complaints. No nausea vomiting.   Filed Vitals:   05/12/15 0127 05/12/15 0638  BP: 137/80 137/65  Pulse: 81 67  Temp: 98.6 F (37 C) 98.6 F (37 C)  Resp: 19 20    Chest: Clear Bilaterally Heart : S1S2 RRR Abdomen: Soft, nontender Ext : No edema Neuro: Alert, oriented x 3  A/P  Small bowel intussusception- CT abdomen showed 6 cm small bowel intussusception in the left hemiabdomen with mild edema of the bowel. MR enterography was recommended. It has been ordered and currently pending. Called general surgery, they will see the patient today. Continue IV fluids.  Sinus tachycardia- patient having intermittent episodes of sinus tachycardia with heart rate greater than 120. Will repeat EKG today.  Hypokalemia- potassium was 2.8 yesterday, after replacement this morning potassium 3.4. Will give KCl 10 mg IV 1. And repeat BMP in a.m. Will check serum magnesium.  DVT prophylaxis-heparin     Meredeth IdeGagan S Rosabel Sermeno [facility] [phone]- [phone]

## 2015-05-13 DIAGNOSIS — F1010 Alcohol abuse, uncomplicated: Secondary | ICD-10-CM | POA: Diagnosis present

## 2015-05-13 DIAGNOSIS — N390 Urinary tract infection, site not specified: Secondary | ICD-10-CM | POA: Diagnosis present

## 2015-05-13 DIAGNOSIS — I10 Essential (primary) hypertension: Secondary | ICD-10-CM | POA: Diagnosis present

## 2015-05-13 DIAGNOSIS — E876 Hypokalemia: Secondary | ICD-10-CM | POA: Diagnosis present

## 2015-05-13 DIAGNOSIS — F4323 Adjustment disorder with mixed anxiety and depressed mood: Secondary | ICD-10-CM | POA: Diagnosis present

## 2015-05-13 DIAGNOSIS — F1910 Other psychoactive substance abuse, uncomplicated: Secondary | ICD-10-CM | POA: Diagnosis present

## 2015-05-13 LAB — BASIC METABOLIC PANEL
Anion gap: 15 (ref 5–15)
BUN: 14 mg/dL (ref 6–20)
CHLORIDE: 104 mmol/L (ref 101–111)
CO2: 20 mmol/L — ABNORMAL LOW (ref 22–32)
CREATININE: 0.67 mg/dL (ref 0.44–1.00)
Calcium: 9 mg/dL (ref 8.9–10.3)
GFR calc Af Amer: >60 mL/min (ref >60)
Glucose, Bld: 116 mg/dL — ABNORMAL HIGH (ref 65–99)
Potassium: 2.9 mmol/L — ABNORMAL LOW (ref 3.5–5.1)
SODIUM: 139 mmol/L (ref 135–145)

## 2015-05-13 LAB — URINE MICROSCOPIC-ADD ON

## 2015-05-13 LAB — MAGNESIUM: Magnesium: 2.1 mg/dL (ref 1.7–2.4)

## 2015-05-13 LAB — SURGICAL PCR SCREEN
MRSA, PCR: NEGATIVE
Staphylococcus aureus: NEGATIVE

## 2015-05-13 LAB — URINALYSIS, ROUTINE W REFLEX MICROSCOPIC
Bilirubin Urine: NEGATIVE
Glucose, UA: NEGATIVE mg/dL
Leukocytes, UA: NEGATIVE
NITRITE: NEGATIVE
PH: 6 (ref 5.0–8.0)
PROTEIN: 100 mg/dL — AB
SPECIFIC GRAVITY, URINE: 1.017 (ref 1.005–1.030)

## 2015-05-13 LAB — GLUCOSE, CAPILLARY: GLUCOSE-CAPILLARY: 172 mg/dL — AB (ref 65–99)

## 2015-05-13 MED ORDER — ADULT MULTIVITAMIN W/MINERALS CH
1.0000 | ORAL_TABLET | Freq: Every day | ORAL | Status: DC
  Filled 2015-05-13 (×2): qty 1

## 2015-05-13 MED ORDER — POTASSIUM CHLORIDE 10 MEQ/100ML IV SOLN: 10.0000 meq | INTRAVENOUS | Status: DC

## 2015-05-13 MED ORDER — LORAZEPAM 1 MG PO TABS
1.0000 mg | ORAL_TABLET | Freq: Four times a day (QID) | ORAL | Status: DC | PRN
  Administered 2015-05-13: 1 mg via ORAL
  Filled 2015-05-13: qty 1

## 2015-05-13 MED ORDER — POTASSIUM CHLORIDE IN NACL 40-0.9 MEQ/L-% IV SOLN
INTRAVENOUS | Status: DC
  Administered 2015-05-13 – 2015-05-15 (×5): 100 mL/h via INTRAVENOUS
  Filled 2015-05-13 (×8): qty 1000

## 2015-05-13 MED ORDER — POTASSIUM CHLORIDE CRYS ER 20 MEQ PO TBCR
40.0000 meq | EXTENDED_RELEASE_TABLET | ORAL | Status: AC
  Administered 2015-05-13: 40 meq via ORAL
  Filled 2015-05-13: qty 2

## 2015-05-13 MED ORDER — THIAMINE HCL 100 MG/ML IJ SOLN: 100.0000 mg | Freq: Every day | INTRAMUSCULAR | Status: DC

## 2015-05-13 MED ORDER — HYDRALAZINE HCL 20 MG/ML IJ SOLN
10.0000 mg | Freq: Once | INTRAMUSCULAR | Status: AC
  Administered 2015-05-13: 10 mg via INTRAVENOUS
  Filled 2015-05-13: qty 1

## 2015-05-13 MED ORDER — LORAZEPAM 2 MG/ML IJ SOLN
0.5000 mg | Freq: Once | INTRAMUSCULAR | Status: AC
  Administered 2015-05-13: 0.5 mg via INTRAVENOUS
  Filled 2015-05-13: qty 1

## 2015-05-13 MED ORDER — POTASSIUM CHLORIDE 10 MEQ/100ML IV SOLN
10.0000 meq | INTRAVENOUS | Status: DC
  Administered 2015-05-13 (×2): 10 meq via INTRAVENOUS
  Filled 2015-05-13: qty 100

## 2015-05-13 MED ORDER — DEXTROSE 5 % IV SOLN
1.0000 g | INTRAVENOUS | Status: DC
  Administered 2015-05-13: 1 g via INTRAVENOUS
  Filled 2015-05-13 (×2): qty 10

## 2015-05-13 MED ORDER — LORAZEPAM 2 MG/ML IJ SOLN: 1.0000 mg | Freq: Four times a day (QID) | INTRAMUSCULAR | Status: DC | PRN

## 2015-05-13 MED ORDER — FAMOTIDINE 20 MG PO TABS
20.0000 mg | ORAL_TABLET | Freq: Two times a day (BID) | ORAL | Status: DC
  Administered 2015-05-13 – 2015-05-15 (×4): 20 mg via ORAL
  Filled 2015-05-13 (×4): qty 1

## 2015-05-13 MED ORDER — FOLIC ACID 1 MG PO TABS
1.0000 mg | ORAL_TABLET | Freq: Every day | ORAL | Status: DC
  Filled 2015-05-13 (×2): qty 1

## 2015-05-13 MED ORDER — VITAMIN B-1 100 MG PO TABS
100.0000 mg | ORAL_TABLET | Freq: Every day | ORAL | Status: DC
  Filled 2015-05-13 (×2): qty 1

## 2015-05-13 MED ORDER — CEFAZOLIN SODIUM-DEXTROSE 2-3 GM-% IV SOLR
2.0000 g | INTRAVENOUS | Status: AC
  Administered 2015-05-14: 2 g via INTRAVENOUS
  Filled 2015-05-13: qty 50

## 2015-05-13 MED ORDER — HYDRALAZINE HCL 20 MG/ML IJ SOLN
10.0000 mg | Freq: Four times a day (QID) | INTRAMUSCULAR | Status: DC | PRN
  Administered 2015-05-13: 10 mg via INTRAVENOUS
  Filled 2015-05-13: qty 1

## 2015-05-13 NOTE — Progress Notes (Signed)
Patient asked for IV potassium to be switched to PO.  Took first dose of PO potassium, but refuses evening dose.  MD notified. No new orders received.

## 2015-05-13 NOTE — Progress Notes (Addendum)
Subjective: Stomach burning because she is hungry, passing flatus, no n/v  Objective: Vital signs in last 24 hours: Temp:  [98 F (36.7 C)-98.1 F (36.7 C)] 98 F (36.7 C) (12/29 0555) Pulse Rate:  [84-94] 94 (12/29 0555) Resp:  [19-20] 20 (12/29 0555) BP: (138-185)/(78-111) 185/111 mmHg (12/29 0555) SpO2:  [98 %] 98 % (12/29 0555) Last BM Date: 05/11/15  Intake/Output from previous day: 12/28 0701 - 12/29 0700 In: 2381.7 [P.O.:240; I.V.:2141.7] Out: 1700 [Urine:1700] Intake/Output this shift:    GI: nt/nd bs present  Lab Results:   Recent Labs  05/11/15 1621 05/12/15 0955  WBC 18.8* 11.3*  HGB 17.7* 16.4*  HCT 51.5* 48.0*  PLT 323 275   BMET  Recent Labs  05/12/15 0955 05/13/15 0615  NA 138 139  K 3.4* 2.9*  CL 105 104  CO2 20* 20*  GLUCOSE 109* 116*  BUN 11 14  CREATININE 0.67 0.67  CALCIUM 8.8* 9.0   PT/INR No results for input(s): LABPROT, INR in the last 72 hours. ABG No results for input(s): PHART, HCO3 in the last 72 hours.  Invalid input(s): PCO2, PO2  Studies/Results: Ct Head Wo Contrast  05/11/2015  CLINICAL DATA:  Confusion and weakness EXAM: CT HEAD WITHOUT CONTRAST TECHNIQUE: Contiguous axial images were obtained from the base of the skull through the vertex without intravenous contrast. COMPARISON:  None. FINDINGS: The examination is significantly limited by patient motion artifact. The bony calvarium appears intact. No soft tissue changes are noted. No findings to suggest acute hemorrhage, acute infarction or space-occupying mass lesion are noted. IMPRESSION: Somewhat limited examination although no acute abnormality is noted. Electronically Signed   By: Alcide Clever M.D.   On: 05/11/2015 22:28   Ct Abdomen Pelvis W Contrast  05/11/2015  CLINICAL DATA:  59 year old female with nausea vomiting and periumbilical pain. EXAM: CT ABDOMEN AND PELVIS WITH CONTRAST TECHNIQUE: Multidetector CT imaging of the abdomen and pelvis was performed  using the standard protocol following bolus administration of intravenous contrast. CONTRAST:  25mL OMNIPAQUE IOHEXOL 300 MG/ML SOLN, OMNIPAQUE IOHEXOL 300 MG/ML SOLN COMPARISON:  None. FINDINGS: Minimal bibasilar dependent atelectatic changes. The lung bases are otherwise clear. No intra-abdominal free air or free fluid. The liver, gallbladder, pancreas, and spleen appear unremarkable. There are bilateral adrenal thickening and nodularity measuring up to 1.6 cm on the right. MRI may provide better evaluation. 1 cm bilateral renal hypodense lesions are incompletely characterized but likely represent. There is no hydronephrosis on either side. The visualized ureters and urinary bladder appear unremarkable. The uterus and the ovaries appear grossly unremarkable. There is approximately cm telescoping of a loop of small bowel within the adjacent small bowel loop in the left lower abdomen compatible with intussusception. Although this may be a transient intussusception the length of the intussusception is concerning for an underlying lead point. Clinical correlation and follow-up with MR enterography recommended. There is mild thickening and edema of the bowel at the intussusception. No pneumatosis identified. There is extensive sigmoid diverticulosis with muscular hypertrophy. Minimal perisigmoid haziness likely represents chronic inflammation and scarring. A mild/early sigmoid diverticulitis is less likely but not excluded. Clinical correlation is recommended. There is no evidence of bowel obstruction. Normal appendix. A small hiatal hernia is noted. There is a 2.7 cm duodenal diverticulum. There is mild aortoiliac atherosclerotic disease. The abdominal aorta and IVC appear patent. The origins of the celiac axis, SMA, IMA as well as the origins of the renal arteries are patent. There is a retro aortic  left renal vein. No portal venous gas identified. There is no adenopathy. The abdominal wall soft tissues appear  unremarkable. There is degenerative changes of the spine. 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 No acute fracture. IMPRESSION: A 6 cm small bowel intussusception in the left hemiabdomen with mild edema of the bowel. No pneumatosis or evidence of obstruction. Follow-up with MR enterography recommended to exclude underlying lead point. Extensive sigmoid diverticulosis with muscular hypertrophy. A mild/early diverticulitis is less likely. Clinical correlation is recommended. No evidence of bowel obstruction. Normal appendix. Electronically Signed   By: Elgie CollardArash  Radparvar M.D.   On: 05/11/2015 22:21    Anti-infectives: Anti-infectives    Start     Dose/Rate Route Frequency Ordered Stop   05/11/15 2300  piperacillin-tazobactam (ZOSYN) IVPB 3.375 g     3.375 g 100 mL/hr over 30 Minutes Intravenous  Once 05/11/15 2252 05/11/15 2344      Assessment/Plan: abd pain, ct with intussusception  Await mre, does not have surgical abdomen, would not give diet until mre back today   Addendum: mre with persistent long segment sb intussusception.  I think she needs dx lsc and likely bowel resection.  I offered to do this today and she does not want to do this.  She wants to do tomorrow.   [facility]KE[facility],Tenia Goh 05/13/2015

## 2015-05-13 NOTE — Progress Notes (Signed)
Subjective: Anxious over not eating, still having some burning in stomach. Pt remains undressed and according to the nurse is self stimulating in room.    Objective: Vital signs in last 24 hours: Temp:  [98 F (36.7 C)-98.1 F (36.7 C)] 98 F (36.7 C) (12/29 0555) Pulse Rate:  [84-94] 94 (12/29 0555) Resp:  [19-20] 20 (12/29 0555) BP: (138-185)/(78-111) 185/111 mmHg (12/29 0555) SpO2:  [98 %] 98 % (12/29 0555) Last BM Date: 05/11/15 NPO Good urine output bp 185/111 this AM O300, I am having them recheck her now. K+ 2.9 Drug screen positive for benzodiazepines and THC 6-30 WBC bacteria and yeast in urine on admit Intake/Output from previous day: 12/28 0701 - 12/29 0700 In: 2381.7 [P.O.:240; I.V.:2141.7] Out: 1700 [Urine:1700] Intake/Output this shift: Total I/O In: -  Out: 250 [Urine:250]  General appearance: alert, cooperative and very anxious GI: soft, non-tender; bowel sounds normal; no masses,  no organomegaly  Lab Results:   Recent Labs  05/11/15 1621 05/12/15 0955  WBC 18.8* 11.3*  HGB 17.7* 16.4*  HCT 51.5* 48.0*  PLT 323 275    BMET  Recent Labs  05/12/15 0955 05/13/15 0615  NA 138 139  K 3.4* 2.9*  CL 105 104  CO2 20* 20*  GLUCOSE 109* 116*  BUN 11 14  CREATININE 0.67 0.67  CALCIUM 8.8* 9.0   PT/INR No results for input(s): LABPROT, INR in the last 72 hours.   Recent Labs Lab 05/11/15 1621  AST 47*  ALT 31  ALKPHOS 89  BILITOT 1.3*  PROT 8.3*  ALBUMIN 5.1*     Lipase     Component Value Date/Time   LIPASE 25 05/11/2015 1621     Studies/Results: Ct Head Wo Contrast  05/11/2015  CLINICAL DATA:  Confusion and weakness EXAM: CT HEAD WITHOUT CONTRAST TECHNIQUE: Contiguous axial images were obtained from the base of the skull through the vertex without intravenous contrast. COMPARISON:  None. FINDINGS: The examination is significantly limited by patient motion artifact. The bony calvarium appears intact. No soft tissue changes  are noted. No findings to suggest acute hemorrhage, acute infarction or space-occupying mass lesion are noted. IMPRESSION: Somewhat limited examination although no acute abnormality is noted. Electronically Signed   By: Alcide Clever M.D.   On: 05/11/2015 22:28   Ct Abdomen Pelvis W Contrast  05/11/2015  CLINICAL DATA:  59 year old female with nausea vomiting and periumbilical pain. EXAM: CT ABDOMEN AND PELVIS WITH CONTRAST TECHNIQUE: Multidetector CT imaging of the abdomen and pelvis was performed using the standard protocol following bolus administration of intravenous contrast. CONTRAST:  25mL OMNIPAQUE IOHEXOL 300 MG/ML SOLN, OMNIPAQUE IOHEXOL 300 MG/ML SOLN COMPARISON:  None. FINDINGS: Minimal bibasilar dependent atelectatic changes. The lung bases are otherwise clear. No intra-abdominal free air or free fluid. The liver, gallbladder, pancreas, and spleen appear unremarkable. There are bilateral adrenal thickening and nodularity measuring up to 1.6 cm on the right. MRI may provide better evaluation. 1 cm bilateral renal hypodense lesions are incompletely characterized but likely represent. There is no hydronephrosis on either side. The visualized ureters and urinary bladder appear unremarkable. The uterus and the ovaries appear grossly unremarkable. There is approximately cm telescoping of a loop of small bowel within the adjacent small bowel loop in the left lower abdomen compatible with intussusception. Although this may be a transient intussusception the length of the intussusception is concerning for an underlying lead point. Clinical correlation and follow-up with MR enterography recommended. There is mild thickening and edema of  the bowel at the intussusception. No pneumatosis identified. There is extensive sigmoid diverticulosis with muscular hypertrophy. Minimal perisigmoid haziness likely represents chronic inflammation and scarring. A mild/early sigmoid diverticulitis is less likely but not  excluded. Clinical correlation is recommended. There is no evidence of bowel obstruction. Normal appendix. A small hiatal hernia is noted. There is a 2.7 cm duodenal diverticulum. There is mild aortoiliac atherosclerotic disease. The abdominal aorta and IVC appear patent. The origins of the celiac axis, SMA, IMA as well as the origins of the renal arteries are patent. There is a retro aortic left renal vein. No portal venous gas identified. There is no adenopathy. The abdominal wall soft tissues appear unremarkable. There is degenerative changes of the spine. 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 No acute fracture. IMPRESSION: A 6 cm small bowel intussusception in the left hemiabdomen with mild edema of the bowel. No pneumatosis or evidence of obstruction. Follow-up with MR enterography recommended to exclude underlying lead point. Extensive sigmoid diverticulosis with muscular hypertrophy. A mild/early diverticulitis is less likely. Clinical correlation is recommended. No evidence of bowel obstruction. Normal appendix. Electronically Signed   By: Elgie Collard M.D.   On: 05/11/2015 22:21   Mr [name] W/o W/cm  05/13/2015  CLINICAL DATA:  Abdominal pain and vomiting. Small bowel intussusception on CT. EXAM: MR ABDOMEN AND PELVIS WITHOUT AND WITH CONTRAST (MR ENTEROGRAPHY) TECHNIQUE: Multiplanar, multisequence MRI of the abdomen and pelvis was performed both before and during bolus administration of intravenous contrast. Negative oral contrast VoLumen was given. CONTRAST:  15mL MULTIHANCE GADOBENATE DIMEGLUMINE 529 MG/ML IV SOLN COMPARISON:  05/11/2015 FINDINGS: Despite efforts by the technologist and patient, motion artifact is present on today's exam and could not be eliminated. This reduces exam sensitivity and specificity. In addition, the patient was not able to drink the VoLumen contrast due to nausea. MRI ABDOMEN: Lower chest:  Unremarkable Hepatobiliary: 2 gallstones are  identified in the gallbladder, each measuring approximately 1.7 cm in diameter. No biliary dilatation or gas in the biliary tree. No additional hepatic abnormalities observed. Pancreas: Pancreas divisum. Spleen: Unremarkable Adrenals/Urinary Tract: Bilateral adrenal masses present, measuring 1.6 by 2.1 cm on the right and 2.9 by 2.0 cm on the left. In and out of phase images were not performed due to the specialized enterography protocol which does not include adrenal in and out of phase images. Accordingly these adrenal masses are nonspecific on MRI. Is that said, on yesterday's CT scan the right mass had a relative washout of 60% and the left mass and a relative washout of 64%, both of which are compatible with adrenal adenomas. Small bilateral renal cysts appear benign. The 1.3 cm left kidney upper pole cyst has a single internal septation and accordingly is probably Bosniak category 2 (complex but benign). Stomach/Bowel: Periampullary duodenal diverticulum noted. Upper abdominal bowel otherwise unremarkable. Vascular/Lymphatic: Unremarkable in the upper abdomen. Other: No supplemental non-categorized findings. Musculoskeletal: Lumbar spondylosis and degenerative disc disease. MRI PELVIS: Lower Urinary Tract: Unremarkable Stomach/Bowel: The left lower quadrant jejuno jejunal intussusception is still present and is shown for example on images 103-116 of series 1101 is is the length of the intussusception is currently about 4 cm. No differential enhancement in the bowel to suggest obvious underlying lesion. Please note that the patient was not able to drink the VoLumen contrast, and as a result the negative contrast defect on today's exam is significantly diminished. There is little in the way of luminal contents in the vicinity of the  intussusception. Sigmoid diverticulosis without active diverticulitis. Vascular/Lymphatic: Unremarkable Reproductive: Heterogeneous endometrium approximately 1.3 cm in thickness.  Appearance seems abnormal for patient age and pelvic sonography in the nonemergent setting is recommended. Other: No supplemental non-categorized findings. Musculoskeletal: 2 cm cystic lesion along the lateral margin of the left upper acetabulum suspicious for a paralabral cyst. IMPRESSION: 1. Persistent small bowel intussusception without dilated bowel to suggest obstruction. I do not see a definite lead point. On the CT, there was some density along the tip of the intussusception which could possibly represent an intra left (or inspissated barium), but such a finding is not readily apparent on today's MRI. 2. Sensitivity adversely affected by motion artifact and also the lack of VoLumen contrast. The patient was unable to drink the contrast because of nausea. 3. Cholelithiasis.  No pneumobilia to suggest gallstone ileus. 4. Sigmoid diverticulosis 5. Lumbar spondylosis and degenerative disc disease. 6. Thickened and heterogeneous endometrium, considered abnormal for age. In the nonemergent setting, pelvic sonography is recommended to exclude endometrial polyp or mass. 7. Bilateral adrenal adenomas. Electronically Signed   By: Gaylyn Rong M.D.   On: 05/13/2015 08:31   Mr [name] Pelvis W/o W/cm  05/13/2015  CLINICAL DATA:  Abdominal pain and vomiting. Small bowel intussusception on CT. EXAM: MR ABDOMEN AND PELVIS WITHOUT AND WITH CONTRAST (MR ENTEROGRAPHY) TECHNIQUE: Multiplanar, multisequence MRI of the abdomen and pelvis was performed both before and during bolus administration of intravenous contrast. Negative oral contrast VoLumen was given. CONTRAST:  15mL MULTIHANCE GADOBENATE DIMEGLUMINE 529 MG/ML IV SOLN COMPARISON:  05/11/2015 FINDINGS: Despite efforts by the technologist and patient, motion artifact is present on today's exam and could not be eliminated. This reduces exam sensitivity and specificity. In addition, the patient was not able to drink the VoLumen contrast due to nausea. MRI ABDOMEN:  Lower chest:  Unremarkable Hepatobiliary: 2 gallstones are identified in the gallbladder, each measuring approximately 1.7 cm in diameter. No biliary dilatation or gas in the biliary tree. No additional hepatic abnormalities observed. Pancreas: Pancreas divisum. Spleen: Unremarkable Adrenals/Urinary Tract: Bilateral adrenal masses present, measuring 1.6 by 2.1 cm on the right and 2.9 by 2.0 cm on the left. In and out of phase images were not performed due to the specialized enterography protocol which does not include adrenal in and out of phase images. Accordingly these adrenal masses are nonspecific on MRI. Is that said, on yesterday's CT scan the right mass had a relative washout of 60% and the left mass and a relative washout of 64%, both of which are compatible with adrenal adenomas. Small bilateral renal cysts appear benign. The 1.3 cm left kidney upper pole cyst has a single internal septation and accordingly is probably Bosniak category 2 (complex but benign). Stomach/Bowel: Periampullary duodenal diverticulum noted. Upper abdominal bowel otherwise unremarkable. Vascular/Lymphatic: Unremarkable in the upper abdomen. Other: No supplemental non-categorized findings. Musculoskeletal: Lumbar spondylosis and degenerative disc disease. MRI PELVIS: Lower Urinary Tract: Unremarkable Stomach/Bowel: The left lower quadrant jejuno jejunal intussusception is still present and is shown for example on images 103-116 of series 1101 is is the length of the intussusception is currently about 4 cm. No differential enhancement in the bowel to suggest obvious underlying lesion. Please note that the patient was not able to drink the VoLumen contrast, and as a result the negative contrast defect on today's exam is significantly diminished. There is little in the way of luminal contents in the vicinity of the intussusception. Sigmoid diverticulosis without active diverticulitis. Vascular/Lymphatic: Unremarkable Reproductive:  Heterogeneous endometrium  approximately 1.3 cm in thickness. Appearance seems abnormal for patient age and pelvic sonography in the nonemergent setting is recommended. Other: No supplemental non-categorized findings. Musculoskeletal: 2 cm cystic lesion along the lateral margin of the left upper acetabulum suspicious for a paralabral cyst. IMPRESSION: 1. Persistent small bowel intussusception without dilated bowel to suggest obstruction. I do not see a definite lead point. On the CT, there was some density along the tip of the intussusception which could possibly represent an intra left (or inspissated barium), but such a finding is not readily apparent on today's MRI. 2. Sensitivity adversely affected by motion artifact and also the lack of VoLumen contrast. The patient was unable to drink the contrast because of nausea. 3. Cholelithiasis.  No pneumobilia to suggest gallstone ileus. 4. Sigmoid diverticulosis 5. Lumbar spondylosis and degenerative disc disease. 6. Thickened and heterogeneous endometrium, considered abnormal for age. In the nonemergent setting, pelvic sonography is recommended to exclude endometrial polyp or mass. 7. Bilateral adrenal adenomas. Electronically Signed   By: Gaylyn RongWalter  Liebkemann M.D.   On: 05/13/2015 08:31    Medications: . heparin  5,000 Units Subcutaneous 3 times per day  . potassium chloride  10 mEq Intravenous Q1 Hr x 5  . sertraline  150 mg Oral Daily   . sodium chloride 1,000 mL (05/13/15 0553)   Prior to Admission medications   Medication Sig Start Date End Date Taking? Authorizing Provider  ALPRAZolam Prudy Feeler(XANAX) 1 MG tablet Take 1 mg by mouth 3 (three) times daily as needed for anxiety.    Yes Historical Provider, MD  hydrochlorothiazide (HYDRODIURIL) 25 MG tablet Take 25 mg by mouth daily.   Yes Historical Provider, MD  naproxen sodium (ANAPROX) 220 MG tablet Take 220 mg by mouth 2 (two) times daily as needed. For pain   Yes Historical Provider, MD  sertraline (ZOLOFT)  100 MG tablet Take 150 mg by mouth daily.   Yes Historical Provider, MD  tetrahydrozoline 0.05 % ophthalmic solution Place 1 drop into both eyes 2 (two) times daily as needed. For dry eyes   Yes Historical Provider, MD  Vitamin D, Ergocalciferol, (DRISDOL) 50000 units CAPS capsule Take 50,000 Units by mouth every 7 (seven) days.   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 Add CIWA protocol orders in case we are underestimating her home polysubstance use.  Clears till midnight.  For exploratory laparotomy and possible small bowel resection tomorrow.  I will recheck labs in AM also.  I will defer BP to Medicine.     LOS: 2 days    Danna Sewell 05/13/2015

## 2015-05-13 NOTE — Progress Notes (Addendum)
[facility] PROGRESS NOTE  Leonard SchwartzKathy Ferrucci NUU:[id number] DOB: 06/17/1955 DOA: 05/11/2015 PCP: No primary care provider on file.  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 Patient was quite agitated in the ED and required her dose IV Ativan. Initially thought to be due to psychiatric, and/or associated with substance abuse. Workup done showed leukocytosis with WBC of 8.8, fever of 99.44F, mild tachycardia with potassium of 2.8, UA positive for UTI with abduction positive for THC and benzodiazepine, mild transaminitis. Head CT was unremarkable. A CT of the abdomen and pelvis showed 6 cm small bowel intussusception and diverticulosis. Surgery was consulted and [facility] and admitted to hospitalist service.  Assessment/Plan: Small bowel intussusception Symptoms appear to be better the patient complains of abdominal discomfort with nausea. No more vomiting. Her abdominal exam is soft with good bowel sounds. MRI enterography done showing persistent intussusception without small bowel obstruction. -Patient started on clears. Plan on exploratory laparotomy in a.m. Will BNP after midnight. -Replenish low potassium. When necessary phenergan for nausea. continue pepcid.   Anxiety and? Agitation Patient's home dose Xanax resumed. Also concern for early alcohol withdrawal symptoms. Placed on CIWA. Continue thiamine and folate and multivitamin.  UTI On empiric Rocephin. Culture is not sent on admission.  Essential hypertension Blood pressure elevated. I placed her on when necessary hydralazine. Patient is on HCTZ at home.  Depression Continue sertraline.  Polysubstance  abuse Counseled on cessation.  DVT prophylaxis: Subcutaneous heparin  Diet: Clears, improved after midnight   Code Status: Full code Family Communication: None at bedside Disposition Plan: Home once improved.   Consultants:  Surgery  Procedures:  MRI enterography  CT abdomen and pelvis  Antibiotics:  IV Rocephin  HPI/Subjective: Seen and examined. Complains of left lower quadrant pain and some nausea. No vomiting or diarrhea. As per surgeon's note pt stimulating herself in her room.  Objective: Filed Vitals:   05/13/15 0555 05/13/15 1021  BP: 185/111 164/96  Pulse: 94 117  Temp: 98 F (36.7 C) 98.2 F (36.8 C)  Resp: 20 18    Intake/Output Summary (Last 24 hours) at 05/13/15 1333 Last data filed at 05/13/15 1052  Gross per 24 hour  Intake 2381.67 ml  Output   1700 ml  Net 681.67 ml   Filed Weights   05/11/15 1531 05/12/15 0127  Weight: 68.04 kg (150 lb) 70.2 kg (154 lb 12.2 oz)    Exam:   General:  Middle aged female not in distress  HEENT: No pallor, moist mucosa  Chest: Clear to auscultation bilaterally  CVS: Normal S1 and S2, no murmurs  GI: Soft, nondistended, Bowel sounds present, mild left lower quadrant tenderness   Musculoskeletal: Warm, no edema  CNS: Alert and oriented  Data Reviewed: Basic Metabolic Panel:  Recent Labs Lab 05/11/15 1621 05/12/15 0955 05/12/15 1700 05/13/15 0615  NA 139 138  --  139  K 2.8* 3.4*  --  2.9*  CL 101 105  --  104  CO2 22 20*  --  20*  GLUCOSE 163* 109*  --  116*  BUN 26* 11  --  14  CREATININE 0.78 0.67  --  0.67  CALCIUM 9.6 8.8*  --  9.0  MG  --   --  1.9 2.1   Liver Function Tests:  Recent Labs Lab 05/11/15 1621  AST 47*  ALT 31  ALKPHOS 89  BILITOT 1.3*  PROT 8.3*  ALBUMIN 5.1*    Recent Labs Lab 05/11/15 1621  LIPASE 25   No results for input(s): AMMONIA in the last 168 hours. CBC:  Recent Labs Lab 05/11/15 1621 05/12/15 0955  WBC 18.8* 11.3*  NEUTROABS 16.8*   --   HGB 17.7* 16.4*  HCT 51.5* 48.0*  MCV 90.2 92.8  PLT 323 275   Cardiac Enzymes: No results for input(s): CKTOTAL, CKMB, CKMBINDEX, TROPONINI in the last 168 hours. BNP (last 3 results) No results for input(s): BNP in the last 8760 hours.  ProBNP (last 3 results) No results for input(s): PROBNP in the last 8760 hours.  CBG:  Recent Labs Lab 05/13/15 0823  GLUCAP 172*    No results found for this or any previous visit (from the past 240 hour(s)).   Studies: Ct Head Wo Contrast  05/11/2015  CLINICAL DATA:  Confusion and weakness EXAM: CT HEAD WITHOUT CONTRAST TECHNIQUE: Contiguous axial images were obtained from the base of the skull through the vertex without intravenous contrast. COMPARISON:  None. FINDINGS: The examination is significantly limited by patient motion artifact. The bony calvarium appears intact. No soft tissue changes are noted. No findings to suggest acute hemorrhage, acute infarction or space-occupying mass lesion are noted. IMPRESSION: Somewhat limited examination although no acute abnormality is noted. Electronically Signed   By: Alcide Clever M.D.   On: 05/11/2015 22:28   Ct Abdomen Pelvis W Contrast  05/11/2015  CLINICAL DATA:  59 year old female with nausea vomiting and periumbilical pain. EXAM: CT ABDOMEN AND PELVIS WITH CONTRAST TECHNIQUE: Multidetector CT imaging of the abdomen and pelvis was performed using the standard protocol following bolus administration of intravenous contrast. CONTRAST:  25mL OMNIPAQUE IOHEXOL 300 MG/ML SOLN, OMNIPAQUE IOHEXOL 300 MG/ML SOLN COMPARISON:  None. FINDINGS: Minimal bibasilar dependent atelectatic changes. The lung bases are otherwise clear. No intra-abdominal free air or free fluid. The liver, gallbladder, pancreas, and spleen appear unremarkable. There are bilateral adrenal thickening and nodularity measuring up to 1.6 cm on the right. MRI may provide better evaluation. 1 cm bilateral renal hypodense lesions are  incompletely characterized but likely represent. There is no hydronephrosis on either side. The visualized ureters and urinary bladder appear unremarkable. The uterus and the ovaries appear grossly unremarkable. There is approximately cm telescoping of a loop of small bowel within the adjacent small bowel loop in the left lower abdomen compatible with intussusception. Although this may be a transient intussusception the length of the intussusception is concerning for an underlying lead point. Clinical correlation and follow-up with MR enterography recommended. There is mild thickening and edema of the bowel at the intussusception. No pneumatosis identified. There is extensive sigmoid diverticulosis with muscular hypertrophy. Minimal perisigmoid haziness likely represents chronic inflammation and scarring. A mild/early sigmoid diverticulitis is less likely but not excluded. Clinical correlation is recommended. There is no evidence of bowel obstruction. Normal appendix. A small hiatal hernia is noted. There is a 2.7 cm duodenal diverticulum. There is mild aortoiliac atherosclerotic disease. The abdominal aorta and IVC appear patent. The origins of the celiac axis, SMA, IMA as well as the origins of the renal arteries are patent. There is a retro aortic left renal vein. No portal venous gas identified. There is no adenopathy. The abdominal wall soft tissues appear unremarkable. There is degenerative changes of the spine. Bilateral L5 pars  defects with grade 1 L5-S1 anterolisthesis. No acute fracture. IMPRESSION: A 6 cm small bowel intussusception in the left hemiabdomen with mild edema of the bowel. No pneumatosis or evidence of obstruction. Follow-up with MR enterography recommended to exclude underlying lead point. Extensive sigmoid diverticulosis with muscular hypertrophy. A mild/early diverticulitis is less likely. Clinical correlation is recommended. No evidence of bowel obstruction. Normal appendix.  Electronically Signed   By: Elgie Collard M.D.   On: 05/11/2015 22:21   Mr [name] W/o W/cm  05/13/2015  CLINICAL DATA:  Abdominal pain and vomiting. Small bowel intussusception on CT. EXAM: MR ABDOMEN AND PELVIS WITHOUT AND WITH CONTRAST (MR ENTEROGRAPHY) TECHNIQUE: Multiplanar, multisequence MRI of the abdomen and pelvis was performed both before and during bolus administration of intravenous contrast. Negative oral contrast VoLumen was given. CONTRAST:  15mL MULTIHANCE GADOBENATE DIMEGLUMINE 529 MG/ML IV SOLN COMPARISON:  05/11/2015 FINDINGS: Despite efforts by the technologist and patient, motion artifact is present on today's exam and could not be eliminated. This reduces exam sensitivity and specificity. In addition, the patient was not able to drink the VoLumen contrast due to nausea. MRI ABDOMEN: Lower chest:  Unremarkable Hepatobiliary: 2 gallstones are identified in the gallbladder, each measuring approximately 1.7 cm in diameter. No biliary dilatation or gas in the biliary tree. No additional hepatic abnormalities observed. Pancreas: Pancreas divisum. Spleen: Unremarkable Adrenals/Urinary Tract: Bilateral adrenal masses present, measuring 1.6 by 2.1 cm on the right and 2.9 by 2.0 cm on the left. In and out of phase images were not performed due to the specialized enterography protocol which does not include adrenal in and out of phase images. Accordingly these adrenal masses are nonspecific on MRI. Is that said, on yesterday's CT scan the right mass had a relative washout of 60% and the left mass and a relative washout of 64%, both of which are compatible with adrenal adenomas. Small bilateral renal cysts appear benign. The 1.3 cm left kidney upper pole cyst has a single internal septation and accordingly is probably Bosniak category 2 (complex but benign). Stomach/Bowel: Periampullary duodenal diverticulum noted. Upper abdominal bowel otherwise unremarkable. Vascular/Lymphatic: Unremarkable in  the upper abdomen. Other: No supplemental non-categorized findings. Musculoskeletal: Lumbar spondylosis and degenerative disc disease. MRI PELVIS: Lower Urinary Tract: Unremarkable Stomach/Bowel: The left lower quadrant jejuno jejunal intussusception is still present and is shown for example on images 103-116 of series 1101 is is the length of the intussusception is currently about 4 cm. No differential enhancement in the bowel to suggest obvious underlying lesion. Please note that the patient was not able to drink the VoLumen contrast, and as a result the negative contrast defect on today's exam is significantly diminished. There is little in the way of luminal contents in the vicinity of the intussusception. Sigmoid diverticulosis without active diverticulitis. Vascular/Lymphatic: Unremarkable Reproductive: Heterogeneous endometrium approximately 1.3 cm in thickness. Appearance seems abnormal for patient age and pelvic sonography in the nonemergent setting is recommended. Other: No supplemental non-categorized findings. Musculoskeletal: 2 cm cystic lesion along the lateral margin of the left upper acetabulum suspicious for a paralabral cyst. IMPRESSION: 1. Persistent small bowel intussusception without dilated bowel to suggest obstruction. I do not see a definite lead point. On the CT, there was some density along the tip of the intussusception which could possibly represent an intra left (or inspissated barium), but such a finding is not readily apparent on today's MRI. 2. Sensitivity adversely affected by motion artifact and also the lack of VoLumen contrast. The patient was unable  to drink the contrast because of nausea. 3. Cholelithiasis.  No pneumobilia to suggest gallstone ileus. 4. Sigmoid diverticulosis 5. Lumbar spondylosis and degenerative disc disease. 6. Thickened and heterogeneous endometrium, considered abnormal for age. In the nonemergent setting, pelvic sonography is recommended to exclude  endometrial polyp or mass. 7. Bilateral adrenal adenomas. Electronically Signed   By: Gaylyn Rong M.D.   On: 05/13/2015 08:31   Mr [name] Pelvis W/o W/cm  05/13/2015  CLINICAL DATA:  Abdominal pain and vomiting. Small bowel intussusception on CT. EXAM: MR ABDOMEN AND PELVIS WITHOUT AND WITH CONTRAST (MR ENTEROGRAPHY) TECHNIQUE: Multiplanar, multisequence MRI of the abdomen and pelvis was performed both before and during bolus administration of intravenous contrast. Negative oral contrast VoLumen was given. CONTRAST:  15mL MULTIHANCE GADOBENATE DIMEGLUMINE 529 MG/ML IV SOLN COMPARISON:  05/11/2015 FINDINGS: Despite efforts by the technologist and patient, motion artifact is present on today's exam and could not be eliminated. This reduces exam sensitivity and specificity. In addition, the patient was not able to drink the VoLumen contrast due to nausea. MRI ABDOMEN: Lower chest:  Unremarkable Hepatobiliary: 2 gallstones are identified in the gallbladder, each measuring approximately 1.7 cm in diameter. No biliary dilatation or gas in the biliary tree. No additional hepatic abnormalities observed. Pancreas: Pancreas divisum. Spleen: Unremarkable Adrenals/Urinary Tract: Bilateral adrenal masses present, measuring 1.6 by 2.1 cm on the right and 2.9 by 2.0 cm on the left. In and out of phase images were not performed due to the specialized enterography protocol which does not include adrenal in and out of phase images. Accordingly these adrenal masses are nonspecific on MRI. Is that said, on yesterday's CT scan the right mass had a relative washout of 60% and the left mass and a relative washout of 64%, both of which are compatible with adrenal adenomas. Small bilateral renal cysts appear benign. The 1.3 cm left kidney upper pole cyst has a single internal septation and accordingly is probably Bosniak category 2 (complex but benign). Stomach/Bowel: Periampullary duodenal diverticulum noted. Upper abdominal  bowel otherwise unremarkable. Vascular/Lymphatic: Unremarkable in the upper abdomen. Other: No supplemental non-categorized findings. Musculoskeletal: Lumbar spondylosis and degenerative disc disease. MRI PELVIS: Lower Urinary Tract: Unremarkable Stomach/Bowel: The left lower quadrant jejuno jejunal intussusception is still present and is shown for example on images 103-116 of series 1101 is is the length of the intussusception is currently about 4 cm. No differential enhancement in the bowel to suggest obvious underlying lesion. Please note that the patient was not able to drink the VoLumen contrast, and as a result the negative contrast defect on today's exam is significantly diminished. There is little in the way of luminal contents in the vicinity of the intussusception. Sigmoid diverticulosis without active diverticulitis. Vascular/Lymphatic: Unremarkable Reproductive: Heterogeneous endometrium approximately 1.3 cm in thickness. Appearance seems abnormal for patient age and pelvic sonography in the nonemergent setting is recommended. Other: No supplemental non-categorized findings. Musculoskeletal: 2 cm cystic lesion along the lateral margin of the left upper acetabulum suspicious for a paralabral cyst. IMPRESSION: 1. Persistent small bowel intussusception without dilated bowel to suggest obstruction. I do not see a definite lead point. On the CT, there was some density along the tip of the intussusception which could possibly represent an intra left (or inspissated barium), but such a finding is not readily apparent on today's MRI. 2. Sensitivity adversely affected by motion artifact and also the lack of VoLumen contrast. The patient was unable to drink the contrast because of nausea. 3. Cholelithiasis.  No pneumobilia  to suggest gallstone ileus. 4. Sigmoid diverticulosis 5. Lumbar spondylosis and degenerative disc disease. 6. Thickened and heterogeneous endometrium, considered abnormal for age. In the  nonemergent setting, pelvic sonography is recommended to exclude endometrial polyp or mass. 7. Bilateral adrenal adenomas. Electronically Signed   By: Gaylyn Rong M.D.   On: 05/13/2015 08:31    Scheduled Meds: . [START ON 05/14/2015]  ceFAZolin (ANCEF) IV  2 g Intravenous To SS-Surg  . cefTRIAXone (ROCEPHIN)  IV  1 g Intravenous Q24H  . famotidine  20 mg Oral BID  . folic acid  1 mg Oral Daily  . heparin  5,000 Units Subcutaneous 3 times per day  . multivitamin with minerals  1 tablet Oral Daily  . potassium chloride  40 mEq Oral Q4H  . sertraline  150 mg Oral Daily  . thiamine  100 mg Oral Daily   Or  . thiamine  100 mg Intravenous Daily   Continuous Infusions: . 0.9 % NaCl with KCl 40 mEq / L 100 mL/hr (05/13/15 1119)     Time spent: 25 minutes    Da Michelle  [facility] Pager [phone] If 7PM-7AM, please contact night-coverage at [email or web address], password [facility] 05/13/2015, 1:33 PM  LOS: 2 days

## 2015-05-13 NOTE — Progress Notes (Signed)
Utilization Review Completed.Ana Stone T12/29/2016  

## 2015-05-14 ENCOUNTER — Inpatient Hospital Stay (HOSPITAL_COMMUNITY): Payer: 59 | Admitting: Anesthesiology

## 2015-05-14 ENCOUNTER — Encounter (HOSPITAL_COMMUNITY): Payer: Self-pay | Admitting: Anesthesiology

## 2015-05-14 ENCOUNTER — Encounter (HOSPITAL_COMMUNITY)
Admission: EM | Disposition: A | Discharge status: Home / Self Care | Payer: Self-pay | Source: Home / Self Care | Attending: Internal Medicine

## 2015-05-14 HISTORY — PX: LAPAROSCOPY: SHX197

## 2015-05-14 LAB — CBC
HEMATOCRIT: 48.3 % — AB (ref 36.0–46.0)
Hemoglobin: 16.4 g/dL — ABNORMAL HIGH (ref 12.0–15.0)
MCH: 31.2 pg (ref 26.0–34.0)
MCHC: 34 g/dL (ref 30.0–36.0)
MCV: 91.8 fL (ref 78.0–100.0)
Platelets: 282 10*3/uL (ref 150–400)
RBC: 5.26 MIL/uL — ABNORMAL HIGH (ref 3.87–5.11)
RDW: 13.8 % (ref 11.5–15.5)
WBC: 9.1 10*3/uL (ref 4.0–10.5)

## 2015-05-14 LAB — URINE CULTURE

## 2015-05-14 LAB — BASIC METABOLIC PANEL
ANION GAP: 13 (ref 5–15)
BUN: 12 mg/dL (ref 6–20)
CALCIUM: 8.9 mg/dL (ref 8.9–10.3)
CO2: 22 mmol/L (ref 22–32)
CREATININE: 0.57 mg/dL (ref 0.44–1.00)
Chloride: 102 mmol/L (ref 101–111)
Glucose, Bld: 114 mg/dL — ABNORMAL HIGH (ref 65–99)
Potassium: 3.4 mmol/L — ABNORMAL LOW (ref 3.5–5.1)
SODIUM: 137 mmol/L (ref 135–145)

## 2015-05-14 LAB — MAGNESIUM: Magnesium: 2.1 mg/dL (ref 1.7–2.4)

## 2015-05-14 LAB — APTT: APTT: 24 s (ref 24–37)

## 2015-05-14 LAB — PROTIME-INR
INR: 1.11 (ref 0.00–1.49)
PROTHROMBIN TIME: 14.5 s (ref 11.6–15.2)

## 2015-05-14 SURGERY — LAPAROSCOPY, DIAGNOSTIC
Anesthesia: General

## 2015-05-14 MED ORDER — MIDAZOLAM HCL 2 MG/2ML IJ SOLN
INTRAMUSCULAR | Status: AC
  Filled 2015-05-14: qty 2

## 2015-05-14 MED ORDER — ROCURONIUM BROMIDE 100 MG/10ML IV SOLN
INTRAVENOUS | Status: DC | PRN
  Administered 2015-05-14: 30 mg via INTRAVENOUS

## 2015-05-14 MED ORDER — GLYCOPYRROLATE 0.2 MG/ML IJ SOLN
INTRAMUSCULAR | Status: DC | PRN
  Administered 2015-05-14: 0.4 mg via INTRAVENOUS

## 2015-05-14 MED ORDER — PROMETHAZINE HCL 25 MG/ML IJ SOLN
6.2500 mg | INTRAMUSCULAR | Status: DC | PRN
  Administered 2015-05-14: 6.25 mg via INTRAVENOUS

## 2015-05-14 MED ORDER — PROPOFOL 10 MG/ML IV BOLUS
INTRAVENOUS | Status: DC | PRN
  Administered 2015-05-14: 150 mg via INTRAVENOUS

## 2015-05-14 MED ORDER — PROPOFOL 10 MG/ML IV BOLUS
INTRAVENOUS | Status: AC
  Filled 2015-05-14: qty 20

## 2015-05-14 MED ORDER — ARTIFICIAL TEARS OP OINT
TOPICAL_OINTMENT | OPHTHALMIC | Status: DC | PRN
  Administered 2015-05-14: 1 via OPHTHALMIC

## 2015-05-14 MED ORDER — POTASSIUM CHLORIDE 10 MEQ/100ML IV SOLN
10.0000 meq | INTRAVENOUS | Status: AC
  Administered 2015-05-14 (×4): 10 meq via INTRAVENOUS
  Filled 2015-05-14: qty 100

## 2015-05-14 MED ORDER — FENTANYL CITRATE (PF) 250 MCG/5ML IJ SOLN
INTRAMUSCULAR | Status: AC
  Filled 2015-05-14: qty 5

## 2015-05-14 MED ORDER — OXYCODONE-ACETAMINOPHEN 5-325 MG PO TABS
1.0000 | ORAL_TABLET | ORAL | Status: DC | PRN
Start: 2015-05-14 — End: 2015-05-15
  Administered 2015-05-14 – 2015-05-15 (×5): 2 via ORAL
  Filled 2015-05-14 (×5): qty 2

## 2015-05-14 MED ORDER — FENTANYL CITRATE (PF) 100 MCG/2ML IJ SOLN
INTRAMUSCULAR | Status: DC | PRN
  Administered 2015-05-14: 50 ug via INTRAVENOUS
  Administered 2015-05-14: 100 ug via INTRAVENOUS

## 2015-05-14 MED ORDER — BUPIVACAINE-EPINEPHRINE 0.25% -1:200000 IJ SOLN
INTRAMUSCULAR | Status: DC | PRN
  Administered 2015-05-14: 30 mL

## 2015-05-14 MED ORDER — OXYCODONE HCL 5 MG PO TABS: 5.0000 mg | ORAL_TABLET | Freq: Once | ORAL | Status: DC | PRN

## 2015-05-14 MED ORDER — OXYCODONE HCL 5 MG/5ML PO SOLN: 5.0000 mg | Freq: Once | ORAL | Status: DC | PRN

## 2015-05-14 MED ORDER — 0.9 % SODIUM CHLORIDE (POUR BTL) OPTIME
TOPICAL | Status: DC | PRN
  Administered 2015-05-14: 1000 mL

## 2015-05-14 MED ORDER — ONDANSETRON HCL 4 MG/2ML IJ SOLN
INTRAMUSCULAR | Status: DC | PRN
  Administered 2015-05-14: 4 mg via INTRAVENOUS

## 2015-05-14 MED ORDER — LIDOCAINE HCL (CARDIAC) 20 MG/ML IV SOLN
INTRAVENOUS | Status: DC | PRN
  Administered 2015-05-14: 60 mg via INTRAVENOUS

## 2015-05-14 MED ORDER — MIDAZOLAM HCL 5 MG/5ML IJ SOLN
INTRAMUSCULAR | Status: DC | PRN
  Administered 2015-05-14: 2 mg via INTRAVENOUS

## 2015-05-14 MED ORDER — PHENYLEPHRINE HCL 10 MG/ML IJ SOLN
INTRAMUSCULAR | Status: DC | PRN
  Administered 2015-05-14: 80 ug via INTRAVENOUS

## 2015-05-14 MED ORDER — PROMETHAZINE HCL 25 MG/ML IJ SOLN
INTRAMUSCULAR | Status: AC
  Administered 2015-05-14: 6.25 mg via INTRAVENOUS
  Filled 2015-05-14: qty 1

## 2015-05-14 MED ORDER — PROMETHAZINE HCL 25 MG/ML IJ SOLN
6.2500 mg | Freq: Once | INTRAMUSCULAR | Status: AC
  Administered 2015-05-14: 6.25 mg via INTRAVENOUS
  Filled 2015-05-14: qty 1

## 2015-05-14 MED ORDER — NEOSTIGMINE METHYLSULFATE 10 MG/10ML IV SOLN
INTRAVENOUS | Status: DC | PRN
  Administered 2015-05-14: 3 mg via INTRAVENOUS

## 2015-05-14 MED ORDER — BUPIVACAINE-EPINEPHRINE (PF) 0.25% -1:200000 IJ SOLN
INTRAMUSCULAR | Status: AC
  Filled 2015-05-14: qty 30

## 2015-05-14 MED ORDER — HYDROMORPHONE HCL 1 MG/ML IJ SOLN
INTRAMUSCULAR | Status: AC
  Administered 2015-05-14: 0.5 mg via INTRAVENOUS
  Filled 2015-05-14: qty 1

## 2015-05-14 MED ORDER — ESMOLOL HCL 100 MG/10ML IV SOLN
INTRAVENOUS | Status: DC | PRN
  Administered 2015-05-14: 10 mg via INTRAVENOUS

## 2015-05-14 MED ORDER — LACTATED RINGERS IV SOLN
INTRAVENOUS | Status: DC | PRN
  Administered 2015-05-14 (×2): via INTRAVENOUS

## 2015-05-14 MED ORDER — HYDROMORPHONE HCL 1 MG/ML IJ SOLN
0.2500 mg | INTRAMUSCULAR | Status: DC | PRN
  Administered 2015-05-14 (×2): 0.5 mg via INTRAVENOUS

## 2015-05-14 MED ORDER — LACTATED RINGERS IV SOLN
INTRAVENOUS | Status: DC
  Administered 2015-05-14: 11:00:00 via INTRAVENOUS

## 2015-05-14 MED ORDER — SUCCINYLCHOLINE CHLORIDE 20 MG/ML IJ SOLN
INTRAMUSCULAR | Status: DC | PRN
  Administered 2015-05-14: 100 mg via INTRAVENOUS

## 2015-05-14 SURGICAL SUPPLY — 66 items
APPLIER CLIP 5 13 M/L LIGAMAX5 (MISCELLANEOUS)
BLADE SURG ROTATE 9660 (MISCELLANEOUS) IMPLANT
CANISTER SUCTION 2500CC (MISCELLANEOUS) ×2 IMPLANT
CHLORAPREP W/TINT 26ML (MISCELLANEOUS) ×2 IMPLANT
CLIP APPLIE 5 13 M/L LIGAMAX5 (MISCELLANEOUS) IMPLANT
CLSR STERI-STRIP ANTIMIC 1/2X4 (GAUZE/BANDAGES/DRESSINGS) ×2 IMPLANT
COVER MAYO STAND STRL (DRAPES) IMPLANT
COVER SURGICAL LIGHT HANDLE (MISCELLANEOUS) ×2 IMPLANT
DEVICE TROCAR PUNCTURE CLOSURE (ENDOMECHANICALS) ×2 IMPLANT
DRAPE LAPAROSCOPIC ABDOMINAL (DRAPES) ×2 IMPLANT
DRAPE PROXIMA HALF (DRAPES) IMPLANT
DRAPE UTILITY XL STRL (DRAPES) ×4 IMPLANT
DRAPE WARM FLUID 44X44 (DRAPE) ×2 IMPLANT
DRSG OPSITE POSTOP 4X10 (GAUZE/BANDAGES/DRESSINGS) IMPLANT
DRSG OPSITE POSTOP 4X8 (GAUZE/BANDAGES/DRESSINGS) IMPLANT
ELECT BLADE 6.5 EXT (BLADE) IMPLANT
ELECT CAUTERY BLADE 6.4 (BLADE) ×4 IMPLANT
ELECT REM PT RETURN 9FT ADLT (ELECTROSURGICAL) ×2
ELECTRODE REM PT RTRN 9FT ADLT (ELECTROSURGICAL) ×1 IMPLANT
GAUZE SPONGE 4X4 16PLY XRAY LF (GAUZE/BANDAGES/DRESSINGS) ×2 IMPLANT
GLOVE BIO SURGEON STRL SZ7 (GLOVE) ×2 IMPLANT
GLOVE BIO SURGEON STRL SZ7.5 (GLOVE) ×2 IMPLANT
GLOVE BIOGEL PI IND STRL 7.5 (GLOVE) ×2 IMPLANT
GLOVE BIOGEL PI INDICATOR 7.5 (GLOVE) ×2
GOWN STRL REUS W/ TWL LRG LVL3 (GOWN DISPOSABLE) ×4 IMPLANT
GOWN STRL REUS W/TWL LRG LVL3 (GOWN DISPOSABLE) ×4
KIT BASIN OR (CUSTOM PROCEDURE TRAY) ×2 IMPLANT
KIT ROOM TURNOVER OR (KITS) ×2 IMPLANT
LIGASURE IMPACT 36 18CM CVD LR (INSTRUMENTS) IMPLANT
LIQUID BAND (GAUZE/BANDAGES/DRESSINGS) ×4 IMPLANT
NS IRRIG 1000ML POUR BTL (IV SOLUTION) ×4 IMPLANT
PACK GENERAL/GYN (CUSTOM PROCEDURE TRAY) ×2 IMPLANT
PAD ARMBOARD 7.5X6 YLW CONV (MISCELLANEOUS) ×2 IMPLANT
PENCIL BUTTON HOLSTER BLD 10FT (ELECTRODE) ×2 IMPLANT
SCISSORS LAP 5X35 DISP (ENDOMECHANICALS) IMPLANT
SET IRRIG TUBING LAPAROSCOPIC (IRRIGATION / IRRIGATOR) IMPLANT
SLEEVE ENDOPATH XCEL 5M (ENDOMECHANICALS) ×4 IMPLANT
SPECIMEN JAR LARGE (MISCELLANEOUS) IMPLANT
SPONGE LAP 18X18 X RAY DECT (DISPOSABLE) IMPLANT
STAPLER VISISTAT 35W (STAPLE) ×2 IMPLANT
STRIP CLOSURE SKIN 1/2X4 (GAUZE/BANDAGES/DRESSINGS) ×2 IMPLANT
SUCTION POOLE TIP (SUCTIONS) ×2 IMPLANT
SUT MNCRL AB 4-0 PS2 18 (SUTURE) ×2 IMPLANT
SUT PDS AB 1 TP1 96 (SUTURE) ×4 IMPLANT
SUT SILK 2 0 (SUTURE) ×1
SUT SILK 2 0 SH CR/8 (SUTURE) ×2 IMPLANT
SUT SILK 2-0 18XBRD TIE 12 (SUTURE) ×1 IMPLANT
SUT SILK 3 0 (SUTURE) ×1
SUT SILK 3 0 SH CR/8 (SUTURE) ×2 IMPLANT
SUT SILK 3-0 18XBRD TIE 12 (SUTURE) ×1 IMPLANT
SUT VIC AB 3-0 SH 27 (SUTURE) ×1
SUT VIC AB 3-0 SH 27X BRD (SUTURE) ×1 IMPLANT
SUT VICRYL 0 UR6 27IN ABS (SUTURE) ×2 IMPLANT
TOWEL OR 17X24 6PK STRL BLUE (TOWEL DISPOSABLE) ×2 IMPLANT
TOWEL OR 17X26 10 PK STRL BLUE (TOWEL DISPOSABLE) ×2 IMPLANT
TRAY FOLEY CATH 14FRSI W/METER (CATHETERS) IMPLANT
TRAY FOLEY CATH 16FRSI W/METER (SET/KITS/TRAYS/PACK) IMPLANT
TRAY FOLEY W/METER SILVER 16FR (SET/KITS/TRAYS/PACK) ×2 IMPLANT
TRAY LAPAROSCOPIC MC (CUSTOM PROCEDURE TRAY) ×2 IMPLANT
TROCAR BLADELESS 5MM (ENDOMECHANICALS) ×2 IMPLANT
TROCAR XCEL BLUNT TIP 100MML (ENDOMECHANICALS) IMPLANT
TROCAR XCEL NON-BLD 11X100MML (ENDOMECHANICALS) IMPLANT
TROCAR XCEL NON-BLD 5MMX100MML (ENDOMECHANICALS) ×2 IMPLANT
TUBE CONNECTING 12X1/4 (SUCTIONS) IMPLANT
TUBING INSUFFLATION (TUBING) ×2 IMPLANT
YANKAUER SUCT BULB TIP NO VENT (SUCTIONS) IMPLANT

## 2015-05-14 NOTE — Anesthesia Procedure Notes (Signed)
Procedure Name: Intubation Date/Time: 05/14/2015 1:33 PM Performed by: Fransisca KaufmannMEYER, Lalonnie Shaffer E Pre-anesthesia Checklist: Patient identified, Emergency Drugs available, Suction available, Patient being monitored and Timeout performed Patient Re-evaluated:Patient Re-evaluated prior to inductionOxygen Delivery Method: Circle system utilized Preoxygenation: Pre-oxygenation with 100% oxygen Intubation Type: IV induction Ventilation: Mask ventilation without difficulty Laryngoscope Size: Miller and 2 Grade View: Grade I Tube type: Oral Tube size: 7.0 mm Number of attempts: 1 Airway Equipment and Method: Stylet Placement Confirmation: ETT inserted through vocal cords under direct vision,  positive ETCO2 and breath sounds checked- equal and bilateral Secured at: 21 cm Tube secured with: Tape Dental Injury: Teeth and Oropharynx as per pre-operative assessment

## 2015-05-14 NOTE — Anesthesia Postprocedure Evaluation (Signed)
Anesthesia Post Note  Patient: Ana SchwartzKathy Tatlock  Procedure(s) Performed: Procedure(s) (LRB): LAPAROSCOPY DIAGNOSTIC (N/A)  Patient location during evaluation: PACU Anesthesia Type: General Level of consciousness: awake and alert Pain management: pain level controlled Vital Signs Assessment: post-procedure vital signs reviewed and stable Respiratory status: spontaneous breathing Cardiovascular status: blood pressure returned to baseline Anesthetic complications: no    Last Vitals:  Filed Vitals:   05/14/15 1653 05/14/15 1808  BP: 140/82 164/89  Pulse: 73 80  Temp: 36.8 C 36.8 C  Resp: 18 18    Last Pain:  Filed Vitals:   05/14/15 1838  PainSc: 6                  Kennieth RadFitzgerald, Coury Grieger E

## 2015-05-14 NOTE — Op Note (Signed)
Preoperative diagnosis: small bowel intussusception Postoperative diagnosis: negative  Procedure: diagnostic laparoscopy Surgeon: Dr Harden MoMatt Derisha Funderburke Anesthesia: general EBL: minimal Specimens: none Drains: none Sponge and needle count correct dispo to pacu stable  Indications: This is a 89 yof with some vague abdominal pains and episode of blood per rectum. She had ct that showed 6 cm small bowel intussusception. An MRE was done showing same without any lesions noted.  I discussed diagnostic laparoscopy to evaluate bowel and for presence of a mass.  Procedure: After informed consent obtained, patient taken to OR. She had SCDs in place. Antibiotics were given. She was placed under general anesthesia without complication. Foley was placed.  She was prepped and draped in standard sterile surgical fashion.  A surgical timeout was performed.  I infiltrated marcaine below umbilicus and made a vertical incision.  I entered the fascia sharply and the peritoneum bluntly.  I placed a 0 vicryl pursestring and introduced a hasson trocar.  I then insufflated the abdomen to 15 mm Hg pressure. I then inserted 2 further 5 mm trocars in the right abdomen.  I then ran the bowel from the ligament of Treitz to the terminal ileum three times. I did not see any intussusception, evidence of obstruction or any mass or enlarged nodes.  The remainder of the abdomen was negative. I elected to stop there. I will send her to get capsule endoscopy to be complete.  I did not think opening would gain anything.  I then removed the hasson trocar and tied the pursestring down. I placed an additional  Vicryl suture with the endoclose device. I then removed remaining trocars and desufflated the abdomen.  These were closed with 4-0 monocryl and glue.  She tolerated this well, was extubated and transferred to recovery stable.

## 2015-05-14 NOTE — Transfer of Care (Signed)
Immediate Anesthesia Transfer of Care Note  Patient: Ana SchwartzKathy Lachman  Procedure(s) Performed: Procedure(s): LAPAROSCOPY DIAGNOSTIC (N/A)  Patient Location: PACU  Anesthesia Type:General  Level of Consciousness: awake, alert , oriented and patient cooperative  Airway & Oxygen Therapy: Patient Spontanous Breathing and Patient connected to nasal cannula oxygen  Post-op Assessment: Report given to RN, Post -op Vital signs reviewed and stable and Patient moving all extremities X 4  Post vital signs: Reviewed and stable  Last Vitals:  Filed Vitals:   05/14/15 0607 05/14/15 1438  BP: 146/77 172/101  Pulse: 95 82  Temp: 37 C 36.8 C  Resp: 19 19    Complications: No apparent anesthesia complications

## 2015-05-14 NOTE — Anesthesia Preprocedure Evaluation (Addendum)
Anesthesia Evaluation  Patient identified by MRN, date of birth, ID band Patient awake    Reviewed: Allergy & Precautions, NPO status , Patient's Chart, lab work & pertinent test results  Airway Mallampati: II  TM Distance: >3 FB Neck ROM: Full    Dental  (+) Dental Advisory Given   Pulmonary Current Smoker,    breath sounds clear to auscultation       Cardiovascular hypertension, Pt. on medications  Rhythm:Regular Rate:Normal     Neuro/Psych Anxiety Depression Hx TBI    GI/Hepatic Neg liver ROS, (+)     substance abuse  alcohol use, intususception   Endo/Other  negative endocrine ROS  Renal/GU negative Renal ROS     Musculoskeletal   Abdominal   Peds  Hematology negative hematology ROS (+)   Anesthesia Other Findings   Reproductive/Obstetrics                            Lab Results  Component Value Date   WBC 9.1 05/14/2015   HGB 16.4* 05/14/2015   HCT 48.3* 05/14/2015   MCV 91.8 05/14/2015   PLT 282 05/14/2015   Lab Results  Component Value Date   CREATININE 0.57 05/14/2015   BUN 12 05/14/2015   NA 137 05/14/2015   K 3.4* 05/14/2015   CL 102 05/14/2015   CO2 22 05/14/2015    Anesthesia Physical Anesthesia Plan  ASA: III  Anesthesia Plan: General   Post-op Pain Management:    Induction: Intravenous and Rapid sequence  Airway Management Planned: Oral ETT  Additional Equipment:   Intra-op Plan:   Post-operative Plan: Extubation in OR  Informed Consent: I have reviewed the patients History and Physical, chart, labs and discussed the procedure including the risks, benefits and alternatives for the proposed anesthesia with the patient or authorized representative who has indicated his/her understanding and acceptance.   Dental advisory given  Plan Discussed with: CRNA  Anesthesia Plan Comments:         Anesthesia Quick Evaluation

## 2015-05-14 NOTE — Progress Notes (Signed)
Subjective: Stomach still burning, agrees to surgery  Objective: Vital signs in last 24 hours: Temp:  [98.2 F (36.8 C)-99.1 F (37.3 C)] 98.6 F (37 C) (12/30 0607) Pulse Rate:  [89-117] 95 (12/30 0607) Resp:  [17-19] 19 (12/30 0607) BP: (146-177)/(77-99) 146/77 mmHg (12/30 0607) SpO2:  [95 %-99 %] 95 % (12/30 0607) Last BM Date: 05/10/15  Intake/Output from previous day: 12/29 0701 - 12/30 0700 In: 2848.3 [P.O.:1130; I.V.:1668.3; IV Piggyback:50] Out: 1900 [Urine:1900] Intake/Output this shift:    GI: soft mild tender  Lab Results:   Recent Labs  05/12/15 0955 05/14/15 0545  WBC 11.3* 9.1  HGB 16.4* 16.4*  HCT 48.0* 48.3*  PLT 275 282   BMET  Recent Labs  05/13/15 0615 05/14/15 0545  NA 139 137  K 2.9* 3.4*  CL 104 102  CO2 20* 22  GLUCOSE 116* 114*  BUN 14 12  CREATININE 0.67 0.57  CALCIUM 9.0 8.9   PT/INR  Recent Labs  05/14/15 0545  LABPROT 14.5  INR 1.11   ABG No results for input(s): PHART, HCO3 in the last 72 hours.  Invalid input(s): PCO2, PO2  Studies/Results: Mr Ana Stone W/o W/cm  05/13/2015  CLINICAL DATA:  Abdominal pain and vomiting. Small bowel intussusception on CT. EXAM: MR ABDOMEN AND PELVIS WITHOUT AND WITH CONTRAST (MR ENTEROGRAPHY) TECHNIQUE: Multiplanar, multisequence MRI of the abdomen and pelvis was performed both before and during bolus administration of intravenous contrast. Negative oral contrast VoLumen was given. CONTRAST:  15mL MULTIHANCE GADOBENATE DIMEGLUMINE 529 MG/ML IV SOLN COMPARISON:  05/11/2015 FINDINGS: Despite efforts by the technologist and patient, motion artifact is present on today's exam and could not be eliminated. This reduces exam sensitivity and specificity. In addition, the patient was not able to drink the VoLumen contrast due to nausea. MRI ABDOMEN: Lower chest:  Unremarkable Hepatobiliary: 2 gallstones are identified in the gallbladder, each measuring approximately 1.7 cm in diameter. No  biliary dilatation or gas in the biliary tree. No additional hepatic abnormalities observed. Pancreas: Pancreas divisum. Spleen: Unremarkable Adrenals/Urinary Tract: Bilateral adrenal masses present, measuring 1.6 by 2.1 cm on the right and 2.9 by 2.0 cm on the left. In and out of phase images were not performed due to the specialized enterography protocol which does not include adrenal in and out of phase images. Accordingly these adrenal masses are nonspecific on MRI. Is that said, on yesterday's CT scan the right mass had a relative washout of 60% and the left mass and a relative washout of 64%, both of which are compatible with adrenal adenomas. Small bilateral renal cysts appear benign. The 1.3 cm left kidney upper pole cyst has a single internal septation and accordingly is probably Bosniak category 2 (complex but benign). Stomach/Bowel: Periampullary duodenal diverticulum noted. Upper abdominal bowel otherwise unremarkable. Vascular/Lymphatic: Unremarkable in the upper abdomen. Other: No supplemental non-categorized findings. Musculoskeletal: Lumbar spondylosis and degenerative disc disease. MRI PELVIS: Lower Urinary Tract: Unremarkable Stomach/Bowel: The left lower quadrant jejuno jejunal intussusception is still present and is shown for example on images 103-116 of series 1101 is is the length of the intussusception is currently about 4 cm. No differential enhancement in the bowel to suggest obvious underlying lesion. Please note that the patient was not able to drink the VoLumen contrast, and as a result the negative contrast defect on today's exam is significantly diminished. There is little in the way of luminal contents in the vicinity of the intussusception. Sigmoid diverticulosis without active diverticulitis. Vascular/Lymphatic: Unremarkable Reproductive: Heterogeneous endometrium  approximately 1.3 cm in thickness. Appearance seems abnormal for patient age and pelvic sonography in the nonemergent  setting is recommended. Other: No supplemental non-categorized findings. Musculoskeletal: 2 cm cystic lesion along the lateral margin of the left upper acetabulum suspicious for a paralabral cyst. IMPRESSION: 1. Persistent small bowel intussusception without dilated bowel to suggest obstruction. I do not see a definite lead point. On the CT, there was some density along the tip of the intussusception which could possibly represent an intra left (or inspissated barium), but such a finding is not readily apparent on today's MRI. 2. Sensitivity adversely affected by motion artifact and also the lack of VoLumen contrast. The patient was unable to drink the contrast because of nausea. 3. Cholelithiasis.  No pneumobilia to suggest gallstone ileus. 4. Sigmoid diverticulosis 5. Lumbar spondylosis and degenerative disc disease. 6. Thickened and heterogeneous endometrium, considered abnormal for age. In the nonemergent setting, pelvic sonography is recommended to exclude endometrial polyp or mass. 7. Bilateral adrenal adenomas. Electronically Signed   By: Gaylyn Rong M.D.   On: 05/13/2015 08:31   Mr Ana Stone Pelvis W/o W/cm  05/13/2015  CLINICAL DATA:  Abdominal pain and vomiting. Small bowel intussusception on CT. EXAM: MR ABDOMEN AND PELVIS WITHOUT AND WITH CONTRAST (MR ENTEROGRAPHY) TECHNIQUE: Multiplanar, multisequence MRI of the abdomen and pelvis was performed both before and during bolus administration of intravenous contrast. Negative oral contrast VoLumen was given. CONTRAST:  15mL MULTIHANCE GADOBENATE DIMEGLUMINE 529 MG/ML IV SOLN COMPARISON:  05/11/2015 FINDINGS: Despite efforts by the technologist and patient, motion artifact is present on today's exam and could not be eliminated. This reduces exam sensitivity and specificity. In addition, the patient was not able to drink the VoLumen contrast due to nausea. MRI ABDOMEN: Lower chest:  Unremarkable Hepatobiliary: 2 gallstones are identified in the  gallbladder, each measuring approximately 1.7 cm in diameter. No biliary dilatation or gas in the biliary tree. No additional hepatic abnormalities observed. Pancreas: Pancreas divisum. Spleen: Unremarkable Adrenals/Urinary Tract: Bilateral adrenal masses present, measuring 1.6 by 2.1 cm on the right and 2.9 by 2.0 cm on the left. In and out of phase images were not performed due to the specialized enterography protocol which does not include adrenal in and out of phase images. Accordingly these adrenal masses are nonspecific on MRI. Is that said, on yesterday's CT scan the right mass had a relative washout of 60% and the left mass and a relative washout of 64%, both of which are compatible with adrenal adenomas. Small bilateral renal cysts appear benign. The 1.3 cm left kidney upper pole cyst has a single internal septation and accordingly is probably Bosniak category 2 (complex but benign). Stomach/Bowel: Periampullary duodenal diverticulum noted. Upper abdominal bowel otherwise unremarkable. Vascular/Lymphatic: Unremarkable in the upper abdomen. Other: No supplemental non-categorized findings. Musculoskeletal: Lumbar spondylosis and degenerative disc disease. MRI PELVIS: Lower Urinary Tract: Unremarkable Stomach/Bowel: The left lower quadrant jejuno jejunal intussusception is still present and is shown for example on images 103-116 of series 1101 is is the length of the intussusception is currently about 4 cm. No differential enhancement in the bowel to suggest obvious underlying lesion. Please note that the patient was not able to drink the VoLumen contrast, and as a result the negative contrast defect on today's exam is significantly diminished. There is little in the way of luminal contents in the vicinity of the intussusception. Sigmoid diverticulosis without active diverticulitis. Vascular/Lymphatic: Unremarkable Reproductive: Heterogeneous endometrium approximately 1.3 cm in thickness. Appearance seems  abnormal for patient age  and pelvic sonography in the nonemergent setting is recommended. Other: No supplemental non-categorized findings. Musculoskeletal: 2 cm cystic lesion along the lateral margin of the left upper acetabulum suspicious for a paralabral cyst. IMPRESSION: 1. Persistent small bowel intussusception without dilated bowel to suggest obstruction. I do not see a definite lead point. On the CT, there was some density along the tip of the intussusception which could possibly represent an intra left (or inspissated barium), but such a finding is not readily apparent on today's MRI. 2. Sensitivity adversely affected by motion artifact and also the lack of VoLumen contrast. The patient was unable to drink the contrast because of nausea. 3. Cholelithiasis.  No pneumobilia to suggest gallstone ileus. 4. Sigmoid diverticulosis 5. Lumbar spondylosis and degenerative disc disease. 6. Thickened and heterogeneous endometrium, considered abnormal for age. In the nonemergent setting, pelvic sonography is recommended to exclude endometrial polyp or mass. 7. Bilateral adrenal adenomas. Electronically Signed   By: Gaylyn RongWalter  Liebkemann M.D.   On: 05/13/2015 08:31    Anti-infectives: Anti-infectives    Start     Dose/Rate Route Frequency Ordered Stop   05/14/15 0800  ceFAZolin (ANCEF) IVPB 2 g/50 mL premix     2 g 100 mL/hr over 30 Minutes Intravenous To [facility] 05/13/15 1040 05/15/15 0800   05/13/15 1200  cefTRIAXone (ROCEPHIN) 1 g in dextrose 5 % 50 mL IVPB     1 g 100 mL/hr over 30 Minutes Intravenous Every 24 hours 05/13/15 1033     05/11/15 2300  piperacillin-tazobactam (ZOSYN) IVPB 3.375 g     3.375 g 100 mL/hr over 30 Minutes Intravenous  Once 05/11/15 2252 05/11/15 2344      Assessment/Plan: sb intussusception Plan diag lsc today, likely bowel resection  [facility],Ana Marinello 05/14/2015

## 2015-05-14 NOTE — Progress Notes (Signed)
[facility] PROGRESS NOTE  Ana Stone ZOX:[id number] DOB: 08-21-1955 DOA: 05/11/2015 PCP: No primary care provider on file.  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 Patient was quite agitated in the ED and required her dose IV Ativan. Initially thought to be due to psychiatric, and/or associated with substance abuse. Workup done showed leukocytosis with WBC of 8.8, fever of 99.33F, mild tachycardia with potassium of 2.8, UA positive for UTI with abduction positive for THC and benzodiazepine, mild transaminitis. Head CT was unremarkable. A CT of the abdomen and pelvis showed 6 cm small bowel intussusception and diverticulosis. Surgery was consulted and patient transferred to [facility] and admitted to hospitalist service.  Assessment/Plan: ?Small bowel intussusception  MRI enterography done showing persistent intussusception without small bowel obstruction. -Patient started on clears. diagnostic  laparotomy done today and was negative for intussusception. Surgery recommends to get capsule endoscopy.  -Replenish low potassium. When necessary phenergan for nausea. continue pepcid.   Anxiety and? Agitation Patient's home dose Xanax resumed. Also concern for early alcohol withdrawal symptoms.  on CIWA. Continue thiamine and folate and multivitamin.  UTI On empiric Rocephin. Culture not sent on admission.  Essential hypertension Blood pressure elevated.  on when necessary hydralazine. Patient is on HCTZ at home.  Depression Continue sertraline.  Polysubstance abuse Counseled on cessation.  Hypokalemia  replenished  DVT prophylaxis: Subcutaneous heparin  Diet: Clears   Code Status:  Full code Family Communication: son at bedside Disposition Plan: Home possibly after capsule endoscopy. D/w Surgery in am   Consultants:  Surgery  Procedures:  MRI enterography  CT abdomen and pelvis  Diagnostic laproscopy 12/30  Antibiotics:  IV Rocephin  HPI/Subjective: Seen and examined after returning from OR. C/o some pain over surgical site.  Objective: Filed Vitals:   05/14/15 1625 05/14/15 1653  BP: 160/99 140/82  Pulse: 76 73  Temp: 98.5 F (36.9 C) 98.3 F (36.8 C)  Resp: 19 18    Intake/Output Summary (Last 24 hours) at 05/14/15 1801 Last data filed at 05/14/15 1625  Gross per 24 hour  Intake 3701.67 ml  Output   1700 ml  Net 2001.67 ml   Filed Weights   05/11/15 1531 05/12/15 0127  Weight: 68.04 kg (150 lb) 70.2 kg (154 lb 12.2 oz)    Exam:   General:  not in distress  HEENT:  moist mucosa  Chest: Clear to auscultation bilaterally  CVS: Normal S1 and S2, no murmurs  GI: Soft, nondistended, Bowel sounds present, laparoscopic site clean  Musculoskeletal: Warm, no edema    Data Reviewed: Basic Metabolic Panel:  Recent Labs Lab 05/11/15 1621 05/12/15 0955 05/12/15 1700 05/13/15 0615 05/14/15 0545  NA 139 138  --  139 137  K 2.8* 3.4*  --  2.9* 3.4*  CL 101 105  --  104 102  CO2 22 20*  --  20* 22  GLUCOSE 163* 109*  --  116* 114*  BUN 26* 11  --  14 12  CREATININE 0.78 0.67  --  0.67 0.57  CALCIUM 9.6 8.8*  --  9.0 8.9  MG  --   --  1.9 2.1 2.1   Liver Function Tests:  Recent Labs Lab 05/11/15 1621  AST 47*  ALT 31  ALKPHOS 89  BILITOT 1.3*  PROT 8.3*  ALBUMIN 5.1*    Recent Labs Lab 05/11/15 1621  LIPASE 25   No results for input(s): AMMONIA in the last 168 hours. CBC:  Recent Labs Lab 05/11/15 1621 05/12/15 0955 05/14/15 0545  WBC 18.8* 11.3* 9.1  NEUTROABS 16.8*  --   --   HGB 17.7* 16.4* 16.4*  HCT 51.5* 48.0* 48.3*  MCV 90.2 92.8 91.8  PLT 323 275 282   Cardiac Enzymes: No results for  input(s): CKTOTAL, CKMB, CKMBINDEX, TROPONINI in the last 168 hours. BNP (last 3 results) No results for input(s): BNP in the last 8760 hours.  ProBNP (last 3 results) No results for input(s): PROBNP in the last 8760 hours.  CBG:  Recent Labs Lab 06/11/15 0823  GLUCAP 172*    Recent Results (from the past 240 hour(s))  Culture, Urine     Status: None   Collection Time: 06/11/2015 10:52 AM  Result Value Ref Range Status   Specimen Description URINE, CLEAN CATCH  Final   Special Requests NONE  Final   Culture 4,000 COLONIES/mL INSIGNIFICANT GROWTH  Final   Report Status 05/14/2015 FINAL  Final  Surgical pcr screen     Status: None   Collection Time: 2015/06/11  4:12 PM  Result Value Ref Range Status   MRSA, PCR NEGATIVE NEGATIVE Final   Staphylococcus aureus NEGATIVE NEGATIVE Final    Comment:        The Xpert SA Assay (FDA approved for NASAL specimens in patients over 59 years of age), is one component of a comprehensive surveillance program.  Test performance has been validated by [facility] for patients greater than or equal to 34 year old. It is not intended to diagnose infection nor to guide or monitor treatment.      Studies: Mr [name] W/o W/cm  Jun 11, 2015  CLINICAL DATA:  Abdominal pain and vomiting. Small bowel intussusception on CT. EXAM: MR ABDOMEN AND PELVIS WITHOUT AND WITH CONTRAST (MR ENTEROGRAPHY) TECHNIQUE: Multiplanar, multisequence MRI of the abdomen and pelvis was performed both before and during bolus administration of intravenous contrast. Negative oral contrast VoLumen was given. CONTRAST:  15mL MULTIHANCE GADOBENATE DIMEGLUMINE 529 MG/ML IV SOLN COMPARISON:  05/11/2015 FINDINGS: Despite efforts by the technologist and patient, motion artifact is present on today's exam and could not be eliminated. This reduces exam sensitivity and specificity. In addition, the patient was not able to drink the VoLumen contrast due to nausea. MRI ABDOMEN: Lower  chest:  Unremarkable Hepatobiliary: 2 gallstones are identified in the gallbladder, each measuring approximately 1.7 cm in diameter. No biliary dilatation or gas in the biliary tree. No additional hepatic abnormalities observed. Pancreas: Pancreas divisum. Spleen: Unremarkable Adrenals/Urinary Tract: Bilateral adrenal masses present, measuring 1.6 by 2.1 cm on the right and 2.9 by 2.0 cm on the left. In and out of phase images were not performed due to the specialized enterography protocol which does not include adrenal in and out of phase images. Accordingly these adrenal masses are nonspecific on MRI. Is that said, on yesterday's CT scan the right mass had a relative washout of 60% and the left mass and a relative washout of 64%, both of which are compatible with adrenal adenomas. Small bilateral renal cysts appear benign. The 1.3 cm left kidney upper pole cyst has a single internal septation and accordingly is probably Bosniak category 2 (complex but benign). Stomach/Bowel: Periampullary duodenal diverticulum noted. Upper abdominal bowel otherwise unremarkable. Vascular/Lymphatic: Unremarkable in  the upper abdomen. Other: No supplemental non-categorized findings. Musculoskeletal: Lumbar spondylosis and degenerative disc disease. MRI PELVIS: Lower Urinary Tract: Unremarkable Stomach/Bowel: The left lower quadrant jejuno jejunal intussusception is still present and is shown for example on images 103-116 of series 1101 is is the length of the intussusception is currently about 4 cm. No differential enhancement in the bowel to suggest obvious underlying lesion. Please note that the patient was not able to drink the VoLumen contrast, and as a result the negative contrast defect on today's exam is significantly diminished. There is little in the way of luminal contents in the vicinity of the intussusception. Sigmoid diverticulosis without active diverticulitis. Vascular/Lymphatic: Unremarkable Reproductive:  Heterogeneous endometrium approximately 1.3 cm in thickness. Appearance seems abnormal for patient age and pelvic sonography in the nonemergent setting is recommended. Other: No supplemental non-categorized findings. Musculoskeletal: 2 cm cystic lesion along the lateral margin of the left upper acetabulum suspicious for a paralabral cyst. IMPRESSION: 1. Persistent small bowel intussusception without dilated bowel to suggest obstruction. I do not see a definite lead point. On the CT, there was some density along the tip of the intussusception which could possibly represent an intra left (or inspissated barium), but such a finding is not readily apparent on today's MRI. 2. Sensitivity adversely affected by motion artifact and also the lack of VoLumen contrast. The patient was unable to drink the contrast because of nausea. 3. Cholelithiasis.  No pneumobilia to suggest gallstone ileus. 4. Sigmoid diverticulosis 5. Lumbar spondylosis and degenerative disc disease. 6. Thickened and heterogeneous endometrium, considered abnormal for age. In the nonemergent setting, pelvic sonography is recommended to exclude endometrial polyp or mass. 7. Bilateral adrenal adenomas. Electronically Signed   By: Gaylyn RongWalter  Liebkemann M.D.   On: 05/13/2015 08:31   Mr Leslye Peerntero Pelvis W/o W/cm  05/13/2015  CLINICAL DATA:  Abdominal pain and vomiting. Small bowel intussusception on CT. EXAM: MR ABDOMEN AND PELVIS WITHOUT AND WITH CONTRAST (MR ENTEROGRAPHY) TECHNIQUE: Multiplanar, multisequence MRI of the abdomen and pelvis was performed both before and during bolus administration of intravenous contrast. Negative oral contrast VoLumen was given. CONTRAST:  15mL MULTIHANCE GADOBENATE DIMEGLUMINE 529 MG/ML IV SOLN COMPARISON:  05/11/2015 FINDINGS: Despite efforts by the technologist and patient, motion artifact is present on today's exam and could not be eliminated. This reduces exam sensitivity and specificity. In addition, the patient was not  able to drink the VoLumen contrast due to nausea. MRI ABDOMEN: Lower chest:  Unremarkable Hepatobiliary: 2 gallstones are identified in the gallbladder, each measuring approximately 1.7 cm in diameter. No biliary dilatation or gas in the biliary tree. No additional hepatic abnormalities observed. Pancreas: Pancreas divisum. Spleen: Unremarkable Adrenals/Urinary Tract: Bilateral adrenal masses present, measuring 1.6 by 2.1 cm on the right and 2.9 by 2.0 cm on the left. In and out of phase images were not performed due to the specialized enterography protocol which does not include adrenal in and out of phase images. Accordingly these adrenal masses are nonspecific on MRI. Is that said, on yesterday's CT scan the right mass had a relative washout of 60% and the left mass and a relative washout of 64%, both of which are compatible with adrenal adenomas. Small bilateral renal cysts appear benign. The 1.3 cm left kidney upper pole cyst has a single internal septation and accordingly is probably Bosniak category 2 (complex but benign). Stomach/Bowel: Periampullary duodenal diverticulum noted. Upper abdominal bowel otherwise unremarkable. Vascular/Lymphatic: Unremarkable in the upper abdomen. Other: No supplemental non-categorized findings. Musculoskeletal: Lumbar spondylosis and  degenerative disc disease. MRI PELVIS: Lower Urinary Tract: Unremarkable Stomach/Bowel: The left lower quadrant jejuno jejunal intussusception is still present and is shown for example on images 103-116 of series 1101 is is the length of the intussusception is currently about 4 cm. No differential enhancement in the bowel to suggest obvious underlying lesion. Please note that the patient was not able to drink the VoLumen contrast, and as a result the negative contrast defect on today's exam is significantly diminished. There is little in the way of luminal contents in the vicinity of the intussusception. Sigmoid diverticulosis without active  diverticulitis. Vascular/Lymphatic: Unremarkable Reproductive: Heterogeneous endometrium approximately 1.3 cm in thickness. Appearance seems abnormal for patient age and pelvic sonography in the nonemergent setting is recommended. Other: No supplemental non-categorized findings. Musculoskeletal: 2 cm cystic lesion along the lateral margin of the left upper acetabulum suspicious for a paralabral cyst. IMPRESSION: 1. Persistent small bowel intussusception without dilated bowel to suggest obstruction. I do not see a definite lead point. On the CT, there was some density along the tip of the intussusception which could possibly represent an intra left (or inspissated barium), but such a finding is not readily apparent on today's MRI. 2. Sensitivity adversely affected by motion artifact and also the lack of VoLumen contrast. The patient was unable to drink the contrast because of nausea. 3. Cholelithiasis.  No pneumobilia to suggest gallstone ileus. 4. Sigmoid diverticulosis 5. Lumbar spondylosis and degenerative disc disease. 6. Thickened and heterogeneous endometrium, considered abnormal for age. In the nonemergent setting, pelvic sonography is recommended to exclude endometrial polyp or mass. 7. Bilateral adrenal adenomas. Electronically Signed   By: Gaylyn Rong M.D.   On: 05/13/2015 08:31    Scheduled Meds: . famotidine  20 mg Oral BID  . folic acid  1 mg Oral Daily  . heparin  5,000 Units Subcutaneous 3 times per day  . multivitamin with minerals  1 tablet Oral Daily  . sertraline  150 mg Oral Daily  . thiamine  100 mg Oral Daily   Or  . thiamine  100 mg Intravenous Daily   Continuous Infusions: . 0.9 % NaCl with KCl 40 mEq / L 100 mL/hr (05/14/15 0848)  . lactated ringers 10 mL/hr at 05/14/15 1037     Time spent: 25 minutes    Eddie North  [facility] Pager [phone] If 7PM-7AM, please contact night-coverage at [email or web address], password [facility] 05/14/2015, 6:01 PM  LOS: 3 days

## 2015-05-15 DIAGNOSIS — R197 Diarrhea, unspecified: Secondary | ICD-10-CM

## 2015-05-15 DIAGNOSIS — N390 Urinary tract infection, site not specified: Secondary | ICD-10-CM

## 2015-05-15 DIAGNOSIS — R1013 Epigastric pain: Secondary | ICD-10-CM

## 2015-05-15 DIAGNOSIS — R1110 Vomiting, unspecified: Secondary | ICD-10-CM | POA: Insufficient documentation

## 2015-05-15 DIAGNOSIS — E860 Dehydration: Secondary | ICD-10-CM | POA: Insufficient documentation

## 2015-05-15 DIAGNOSIS — K561 Intussusception: Principal | ICD-10-CM

## 2015-05-15 DIAGNOSIS — E876 Hypokalemia: Secondary | ICD-10-CM

## 2015-05-15 DIAGNOSIS — I10 Essential (primary) hypertension: Secondary | ICD-10-CM

## 2015-05-15 DIAGNOSIS — F4323 Adjustment disorder with mixed anxiety and depressed mood: Secondary | ICD-10-CM

## 2015-05-15 DIAGNOSIS — F1910 Other psychoactive substance abuse, uncomplicated: Secondary | ICD-10-CM

## 2015-05-15 DIAGNOSIS — F1010 Alcohol abuse, uncomplicated: Secondary | ICD-10-CM

## 2015-05-15 LAB — BASIC METABOLIC PANEL
ANION GAP: 8 (ref 5–15)
BUN: 13 mg/dL (ref 6–20)
CHLORIDE: 100 mmol/L — AB (ref 101–111)
CO2: 27 mmol/L (ref 22–32)
Calcium: 8.4 mg/dL — ABNORMAL LOW (ref 8.9–10.3)
Creatinine, Ser: 0.55 mg/dL (ref 0.44–1.00)
GFR calc non Af Amer: >60 mL/min (ref >60)
GLUCOSE: 132 mg/dL — AB (ref 65–99)
Potassium: 3.1 mmol/L — ABNORMAL LOW (ref 3.5–5.1)
Sodium: 135 mmol/L (ref 135–145)

## 2015-05-15 LAB — POTASSIUM: Potassium: 4.3 mmol/L (ref 3.5–5.1)

## 2015-05-15 MED ORDER — FAMOTIDINE 20 MG PO TABS: 20.0000 mg | ORAL_TABLET | Freq: Two times a day (BID) | ORAL | Status: AC

## 2015-05-15 MED ORDER — OXYCODONE-ACETAMINOPHEN 5-325 MG PO TABS: 1.0000 | ORAL_TABLET | ORAL | Status: DC | PRN

## 2015-05-15 MED ORDER — POTASSIUM CHLORIDE CRYS ER 20 MEQ PO TBCR
40.0000 meq | EXTENDED_RELEASE_TABLET | Freq: Once | ORAL | Status: AC
  Administered 2015-05-15: 40 meq via ORAL
  Filled 2015-05-15: qty 2

## 2015-05-15 NOTE — Progress Notes (Signed)
Leonard SchwartzKathy Penix to be D/C'd  per MD order. Discussed with the patient and all questions fully answered.  VSS, Skin clean, dry and intact without evidence of skin break down, no evidence of skin tears noted.  IV catheter discontinued intact. Site without signs and symptoms of complications. Dressing and pressure applied.  An After Visit Summary was printed and given to the patient. Patient received prescription.  D/c education completed with patient/family including follow up instructions, medication list, d/c activities limitations if indicated, with other d/c instructions as indicated by MD - patient able to verbalize understanding, all questions fully answered.   Patient instructed to return to ED, call 911, or call MD for any changes in condition.   Patient to be escorted via WC, and D/C home via private auto.

## 2015-05-15 NOTE — Discharge Summary (Signed)
Physician Discharge Summary  Ana Stone WUJ:[id number] DOB: 09/19/1955 DOA: 05/11/2015  PCP: No primary care provider on file.  Admit date: 05/11/2015 Discharge date: 05/15/2015  Time spent: 45 minutes  Recommendations for Outpatient Follow-up:  Patient will be discharged to home.  Patient will need to follow up with primary care provider within one week of discharge, repeat BMP.  Follow up with general surgery in 2-3 weeks.  Patient will need to follow up with gastroenterology for capsule study.   Patient should continue medications as prescribed.  Patient should follow a Soft diet.   Discharge Diagnoses:  ? Small bowel intussusception Anxiety and questionable agitation UTI Essential hypertension Depression Polysubstance abuse Hypokalemia  Discharge Condition: stable  Diet recommendation: Soft advance to regular as tolerated  Filed Weights   05/11/15 1531 05/12/15 0127  Weight: 68.04 kg (150 lb) 70.2 kg (154 lb 12.2 oz)    History of present illness:  On 05/12/2015 by Dr. Lyda Perone Ana Stone is a 59 y.o. female who presents to the ED with c/o moderate, intermittent vomiting x4 episodes and diarrhea x5 episodes onset yesterday at 4pm. Patient also reports that she has similar symptoms to above. She has tried nothing for these symptoms and presents to the ED at [facility] for treatment. Patient was demonstrating severe signs of agitation in the ED which improved with  ativan. Although the EDP was more suspicious of a substance or psychatric component to her presentation, her CT abd/pelvis actually came back showing 6cm intussusception.  Here at [facility] she reports no further abdominal pain and is feeling fine, she asks for phenergan, but states that this is for sleep.  Hospital Course:  ?Small bowel intussusception -MRI enterography done showing persistent intussusception without small bowel obstruction. -Patient started on clears. diagnostic laparotomy done 05/14/2015  and was negative for intussusception. Surgery recommends to get capsule endoscopy.  -Replenish low potassium. When necessary phenergan for nausea. continue pepcid. -Follow up with surgery in 2-3 weeks -Follow up with GI for capsule endoscopy  Anxiety and? Agitation -Patient's home dose Xanax resumed. Also concern for early alcohol withdrawal symptoms. on CIWA. Continue thiamine and folate and multivitamin.  UTI -Received empiric Rocephin/cefazolin. Culture not sent on admission.  Essential hypertension -Blood pressure elevated. on when necessary hydralazine. Patient is on HCTZ at home.  Depression -Continue sertraline.  Polysubstance abuse -Counseled on cessation.  Hypokalemia -Replacing, repeat K4.3  Consultants:  Surgery  Procedures:  MRI enterography  CT abdomen and pelvis  Diagnostic laproscopy 12/30  Discharge Exam: Filed Vitals:   05/15/15 0517 05/15/15 1322  BP: 150/80 158/92  Pulse: 71 83  Temp: 98.4 F (36.9 C) 98.2 F (36.8 C)  Resp: 19 18     General: Well developed, well nourished, NAD, appears stated age  HEENT: NCAT,  mucous membranes moist.  Cardiovascular: S1 S2 auscultated, RRR, no murmurs  Respiratory: Clear to auscultation bilaterally with equal chest rise  Abdomen: Soft, nontender, nondistended, + bowel sounds, incisions clean  Extremities: warm dry without cyanosis clubbing or edema  Neuro: AAOx3, nonfocal  Psych: Normal affect and demeanor   Discharge Instructions     Medication List    STOP taking these medications        naproxen sodium 220 MG tablet  Commonly known as:  ANAPROX      TAKE these medications        ALPRAZolam 1 MG tablet  Commonly known as:  XANAX  Take 1 mg by mouth 3 (three) times daily as needed for  anxiety.     famotidine 20 MG tablet  Commonly known as:  PEPCID  Take 1 tablet (20 mg total) by mouth 2 (two) times daily.     hydrochlorothiazide 25 MG tablet  Commonly known as:   HYDRODIURIL  Take 25 mg by mouth daily.     oxyCODONE-acetaminophen 5-325 MG tablet  Commonly known as:  PERCOCET/ROXICET  Take 1-2 tablets by mouth every 4 (four) hours as needed for moderate pain or severe pain.     sertraline 100 MG tablet  Commonly known as:  ZOLOFT  Take 150 mg by mouth daily.     tetrahydrozoline 0.05 % ophthalmic solution  Place 1 drop into both eyes 2 (two) times daily as needed. For dry eyes     Vitamin D (Ergocalciferol) 50000 units Caps capsule  Commonly known as:  DRISDOL  Take 50,000 Units by mouth every 7 (seven) days.       Allergies  Allergen Reactions  . Zithromax [Azithromycin] Rash   Follow-up Information    Call [facility], [place].   Specialty:  General Surgery   Why:  for appt in 2-3 weeks in the [facility] clinic   Contact information:   [address] [place] [address] [phone]      Follow up with Gastronterology Ulyess Mort or Deboraha Sprang.   Why:  For capsule endoscopy      Follow up with Primary care physician. Schedule an appointment as soon as possible for a visit in 1 week.   Why:  Hospital follow up, repeat labs       The results of significant diagnostics from this hospitalization (including imaging, microbiology, ancillary and laboratory) are listed below for reference.    Significant Diagnostic Studies: Ct Head Wo Contrast  05/11/2015  CLINICAL DATA:  Confusion and weakness EXAM: CT HEAD WITHOUT CONTRAST TECHNIQUE: Contiguous axial images were obtained from the base of the skull through the vertex without intravenous contrast. COMPARISON:  None. FINDINGS: The examination is significantly limited by patient motion artifact. The bony calvarium appears intact. No soft tissue changes are noted. No findings to suggest acute hemorrhage, acute infarction or space-occupying mass lesion are noted. IMPRESSION: Somewhat limited examination although no acute abnormality is noted. Electronically Signed    By: Alcide Clever M.D.   On: 05/11/2015 22:28   Ct Abdomen Pelvis W Contrast  05/11/2015  CLINICAL DATA:  59 year old female with nausea vomiting and periumbilical pain. EXAM: CT ABDOMEN AND PELVIS WITH CONTRAST TECHNIQUE: Multidetector CT imaging of the abdomen and pelvis was performed using the standard protocol following bolus administration of intravenous contrast. CONTRAST:  25mL OMNIPAQUE IOHEXOL 300 MG/ML SOLN, OMNIPAQUE IOHEXOL 300 MG/ML SOLN COMPARISON:  None. FINDINGS: Minimal bibasilar dependent atelectatic changes. The lung bases are otherwise clear. No intra-abdominal free air or free fluid. The liver, gallbladder, pancreas, and spleen appear unremarkable. There are bilateral adrenal thickening and nodularity measuring up to 1.6 cm on the right. MRI may provide better evaluation. 1 cm bilateral renal hypodense lesions are incompletely characterized but likely represent. There is no hydronephrosis on either side. The visualized ureters and urinary bladder appear unremarkable. The uterus and the ovaries appear grossly unremarkable. There is approximately cm telescoping of a loop of small bowel within the adjacent small bowel loop in the left lower abdomen compatible with intussusception. Although this may be a transient intussusception the length of the intussusception is concerning for an underlying lead point. Clinical correlation and follow-up with MR enterography recommended.  There is mild thickening and edema of the bowel at the intussusception. No pneumatosis identified. There is extensive sigmoid diverticulosis with muscular hypertrophy. Minimal perisigmoid haziness likely represents chronic inflammation and scarring. A mild/early sigmoid diverticulitis is less likely but not excluded. Clinical correlation is recommended. There is no evidence of bowel obstruction. Normal appendix. A small hiatal hernia is noted. There is a 2.7 cm duodenal diverticulum. There is mild aortoiliac  atherosclerotic disease. The abdominal aorta and IVC appear patent. The origins of the celiac axis, SMA, IMA as well as the origins of the renal arteries are patent. There is a retro aortic left renal vein. No portal venous gas identified. There is no adenopathy. The abdominal wall soft tissues appear unremarkable. There is degenerative changes of the spine. 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 No acute fracture. IMPRESSION: A 6 cm small bowel intussusception in the left hemiabdomen with mild edema of the bowel. No pneumatosis or evidence of obstruction. Follow-up with MR enterography recommended to exclude underlying lead point. Extensive sigmoid diverticulosis with muscular hypertrophy. A mild/early diverticulitis is less likely. Clinical correlation is recommended. No evidence of bowel obstruction. Normal appendix. Electronically Signed   By: Elgie Collard M.D.   On: 05/11/2015 22:21   Mr [name] W/o W/cm  05/13/2015  CLINICAL DATA:  Abdominal pain and vomiting. Small bowel intussusception on CT. EXAM: MR ABDOMEN AND PELVIS WITHOUT AND WITH CONTRAST (MR ENTEROGRAPHY) TECHNIQUE: Multiplanar, multisequence MRI of the abdomen and pelvis was performed both before and during bolus administration of intravenous contrast. Negative oral contrast VoLumen was given. CONTRAST:  15mL MULTIHANCE GADOBENATE DIMEGLUMINE 529 MG/ML IV SOLN COMPARISON:  05/11/2015 FINDINGS: Despite efforts by the technologist and patient, motion artifact is present on today's exam and could not be eliminated. This reduces exam sensitivity and specificity. In addition, the patient was not able to drink the VoLumen contrast due to nausea. MRI ABDOMEN: Lower chest:  Unremarkable Hepatobiliary: 2 gallstones are identified in the gallbladder, each measuring approximately 1.7 cm in diameter. No biliary dilatation or gas in the biliary tree. No additional hepatic abnormalities observed. Pancreas: Pancreas divisum.  Spleen: Unremarkable Adrenals/Urinary Tract: Bilateral adrenal masses present, measuring 1.6 by 2.1 cm on the right and 2.9 by 2.0 cm on the left. In and out of phase images were not performed due to the specialized enterography protocol which does not include adrenal in and out of phase images. Accordingly these adrenal masses are nonspecific on MRI. Is that said, on yesterday's CT scan the right mass had a relative washout of 60% and the left mass and a relative washout of 64%, both of which are compatible with adrenal adenomas. Small bilateral renal cysts appear benign. The 1.3 cm left kidney upper pole cyst has a single internal septation and accordingly is probably Bosniak category 2 (complex but benign). Stomach/Bowel: Periampullary duodenal diverticulum noted. Upper abdominal bowel otherwise unremarkable. Vascular/Lymphatic: Unremarkable in the upper abdomen. Other: No supplemental non-categorized findings. Musculoskeletal: Lumbar spondylosis and degenerative disc disease. MRI PELVIS: Lower Urinary Tract: Unremarkable Stomach/Bowel: The left lower quadrant jejuno jejunal intussusception is still present and is shown for example on images 103-116 of series 1101 is is the length of the intussusception is currently about 4 cm. No differential enhancement in the bowel to suggest obvious underlying lesion. Please note that the patient was not able to drink the VoLumen contrast, and as a result the negative contrast defect on today's exam is significantly diminished. There is little in the way of  luminal contents in the vicinity of the intussusception. Sigmoid diverticulosis without active diverticulitis. Vascular/Lymphatic: Unremarkable Reproductive: Heterogeneous endometrium approximately 1.3 cm in thickness. Appearance seems abnormal for patient age and pelvic sonography in the nonemergent setting is recommended. Other: No supplemental non-categorized findings. Musculoskeletal: 2 cm cystic lesion along the  lateral margin of the left upper acetabulum suspicious for a paralabral cyst. IMPRESSION: 1. Persistent small bowel intussusception without dilated bowel to suggest obstruction. I do not see a definite lead point. On the CT, there was some density along the tip of the intussusception which could possibly represent an intra left (or inspissated barium), but such a finding is not readily apparent on today's MRI. 2. Sensitivity adversely affected by motion artifact and also the lack of VoLumen contrast. The patient was unable to drink the contrast because of nausea. 3. Cholelithiasis.  No pneumobilia to suggest gallstone ileus. 4. Sigmoid diverticulosis 5. Lumbar spondylosis and degenerative disc disease. 6. Thickened and heterogeneous endometrium, considered abnormal for age. In the nonemergent setting, pelvic sonography is recommended to exclude endometrial polyp or mass. 7. Bilateral adrenal adenomas. Electronically Signed   By: Gaylyn Rong M.D.   On: 05/13/2015 08:31   Mr [name] Pelvis W/o W/cm  05/13/2015  CLINICAL DATA:  Abdominal pain and vomiting. Small bowel intussusception on CT. EXAM: MR ABDOMEN AND PELVIS WITHOUT AND WITH CONTRAST (MR ENTEROGRAPHY) TECHNIQUE: Multiplanar, multisequence MRI of the abdomen and pelvis was performed both before and during bolus administration of intravenous contrast. Negative oral contrast VoLumen was given. CONTRAST:  15mL MULTIHANCE GADOBENATE DIMEGLUMINE 529 MG/ML IV SOLN COMPARISON:  05/11/2015 FINDINGS: Despite efforts by the technologist and patient, motion artifact is present on today's exam and could not be eliminated. This reduces exam sensitivity and specificity. In addition, the patient was not able to drink the VoLumen contrast due to nausea. MRI ABDOMEN: Lower chest:  Unremarkable Hepatobiliary: 2 gallstones are identified in the gallbladder, each measuring approximately 1.7 cm in diameter. No biliary dilatation or gas in the biliary tree. No additional  hepatic abnormalities observed. Pancreas: Pancreas divisum. Spleen: Unremarkable Adrenals/Urinary Tract: Bilateral adrenal masses present, measuring 1.6 by 2.1 cm on the right and 2.9 by 2.0 cm on the left. In and out of phase images were not performed due to the specialized enterography protocol which does not include adrenal in and out of phase images. Accordingly these adrenal masses are nonspecific on MRI. Is that said, on yesterday's CT scan the right mass had a relative washout of 60% and the left mass and a relative washout of 64%, both of which are compatible with adrenal adenomas. Small bilateral renal cysts appear benign. The 1.3 cm left kidney upper pole cyst has a single internal septation and accordingly is probably Bosniak category 2 (complex but benign). Stomach/Bowel: Periampullary duodenal diverticulum noted. Upper abdominal bowel otherwise unremarkable. Vascular/Lymphatic: Unremarkable in the upper abdomen. Other: No supplemental non-categorized findings. Musculoskeletal: Lumbar spondylosis and degenerative disc disease. MRI PELVIS: Lower Urinary Tract: Unremarkable Stomach/Bowel: The left lower quadrant jejuno jejunal intussusception is still present and is shown for example on images 103-116 of series 1101 is is the length of the intussusception is currently about 4 cm. No differential enhancement in the bowel to suggest obvious underlying lesion. Please note that the patient was not able to drink the VoLumen contrast, and as a result the negative contrast defect on today's exam is significantly diminished. There is little in the way of luminal contents in the vicinity of the intussusception. Sigmoid diverticulosis without active  diverticulitis. Vascular/Lymphatic: Unremarkable Reproductive: Heterogeneous endometrium approximately 1.3 cm in thickness. Appearance seems abnormal for patient age and pelvic sonography in the nonemergent setting is recommended. Other: No supplemental non-categorized  findings. Musculoskeletal: 2 cm cystic lesion along the lateral margin of the left upper acetabulum suspicious for a paralabral cyst. IMPRESSION: 1. Persistent small bowel intussusception without dilated bowel to suggest obstruction. I do not see a definite lead point. On the CT, there was some density along the tip of the intussusception which could possibly represent an intra left (or inspissated barium), but such a finding is not readily apparent on today's MRI. 2. Sensitivity adversely affected by motion artifact and also the lack of VoLumen contrast. The patient was unable to drink the contrast because of nausea. 3. Cholelithiasis.  No pneumobilia to suggest gallstone ileus. 4. Sigmoid diverticulosis 5. Lumbar spondylosis and degenerative disc disease. 6. Thickened and heterogeneous endometrium, considered abnormal for age. In the nonemergent setting, pelvic sonography is recommended to exclude endometrial polyp or mass. 7. Bilateral adrenal adenomas. Electronically Signed   By: Gaylyn Rong M.D.   On: 05/13/2015 08:31    Microbiology: Recent Results (from the past 240 hour(s))  Culture, Urine     Status: None   Collection Time: 05/13/15 10:52 AM  Result Value Ref Range Status   Specimen Description URINE, CLEAN CATCH  Final   Special Requests NONE  Final   Culture 4,000 COLONIES/mL INSIGNIFICANT GROWTH  Final   Report Status 05/14/2015 FINAL  Final  Surgical pcr screen     Status: None   Collection Time: 05/13/15  4:12 PM  Result Value Ref Range Status   MRSA, PCR NEGATIVE NEGATIVE Final   Staphylococcus aureus NEGATIVE NEGATIVE Final    Comment:        The Xpert SA Assay (FDA approved for NASAL specimens in patients over 64 years of age), is one component of a comprehensive surveillance program.  Test performance has been validated by [facility] for patients greater than or equal to 31 year old. It is not intended to diagnose infection nor to guide or monitor treatment.       Labs: Basic Metabolic Panel:  Recent Labs Lab 05/11/15 1621 05/12/15 0955 05/12/15 1700 05/13/15 0615 05/14/15 0545 05/15/15 0529  NA 139 138  --  139 137 135  K 2.8* 3.4*  --  2.9* 3.4* 3.1*  CL 101 105  --  104 102 100*  CO2 22 20*  --  20* 22 27  GLUCOSE 163* 109*  --  116* 114* 132*  BUN 26* 11  --  14 12 13   CREATININE 0.78 0.67  --  0.67 0.57 0.55  CALCIUM 9.6 8.8*  --  9.0 8.9 8.4*  MG  --   --  1.9 2.1 2.1  --    Liver Function Tests:  Recent Labs Lab 05/11/15 1621  AST 47*  ALT 31  ALKPHOS 89  BILITOT 1.3*  PROT 8.3*  ALBUMIN 5.1*    Recent Labs Lab 05/11/15 1621  LIPASE 25   No results for input(s): AMMONIA in the last 168 hours. CBC:  Recent Labs Lab 05/11/15 1621 05/12/15 0955 05/14/15 0545  WBC 18.8* 11.3* 9.1  NEUTROABS 16.8*  --   --   HGB 17.7* 16.4* 16.4*  HCT 51.5* 48.0* 48.3*  MCV 90.2 92.8 91.8  PLT 323 275 282   Cardiac Enzymes: No results for input(s): CKTOTAL, CKMB, CKMBINDEX, TROPONINI in the last 168 hours. BNP: BNP (last 3 results)  No results for input(s): BNP in the last 8760 hours.  ProBNP (last 3 results) No results for input(s): PROBNP in the last 8760 hours.  CBG:  Recent Labs Lab 05/13/15 0823  GLUCAP 172*       Signed:  Edsel PetrinMIKHAIL, Lean Fayson  [facility] 05/15/2015, 1:51 PM

## 2015-05-15 NOTE — Progress Notes (Addendum)
1 Day Post-Op  Subjective: Doing fine  Objective: Vital signs in last 24 hours: Temp:  [98.2 F (36.8 C)-98.5 F (36.9 C)] 98.4 F (36.9 C) (12/31 0517) Pulse Rate:  [71-82] 71 (12/31 0517) Resp:  [15-21] 19 (12/31 0517) BP: (140-172)/(80-101) 150/80 mmHg (12/31 0517) SpO2:  [91 %-98 %] 91 % (12/31 0517) Last BM Date: 05/10/15  Intake/Output from previous day: 12/30 0701 - 12/31 0700 In: 3000 [P.O.:250; I.V.:2700; IV Piggyback:50] Out: 1300 [Urine:1300] Intake/Output this shift:    GI: approp tender, soft, incisions clean  Lab Results:   Recent Labs  05/12/15 0955 05/14/15 0545  WBC 11.3* 9.1  HGB 16.4* 16.4*  HCT 48.0* 48.3*  PLT 275 282   BMET  Recent Labs  05/14/15 0545 05/15/15 0529  NA 137 135  K 3.4* 3.1*  CL 102 100*  CO2 22 27  GLUCOSE 114* 132*  BUN 12 13  CREATININE 0.57 0.55  CALCIUM 8.9 8.4*   PT/INR  Recent Labs  05/14/15 0545  LABPROT 14.5  INR 1.11   ABG No results for input(s): PHART, HCO3 in the last 72 hours.  Invalid input(s): PCO2, PO2  Studies/Results: No results found.  Anti-infectives: Anti-infectives    Start     Dose/Rate Route Frequency Ordered Stop   05/14/15 0800  ceFAZolin (ANCEF) IVPB 2 g/50 mL premix     2 g 100 mL/hr over 30 Minutes Intravenous To ShortStay Surgical 05/13/15 1040 05/14/15 1340   05/13/15 1200  cefTRIAXone (ROCEPHIN) 1 g in dextrose 5 % 50 mL IVPB  Status:  Discontinued     1 g 100 mL/hr over 30 Minutes Intravenous Every 24 hours 05/13/15 1033 05/14/15 1644   05/11/15 2300  piperacillin-tazobactam (ZOSYN) IVPB 3.375 g     3.375 g 100 mL/hr over 30 Minutes Intravenous  Once 05/11/15 2252 05/11/15 2344      Assessment/Plan: POD 1 dx lsc  Doing fine, discussed surgery Needs outpt gi referral for small bowel capsule study Can go home today from my standpoint  [facility] And James Medical PLLCWAKEFIELD,Kesler Wickham 05/15/2015

## 2015-05-15 NOTE — Discharge Instructions (Signed)

## 2015-05-18 ENCOUNTER — Encounter (HOSPITAL_COMMUNITY): Payer: Self-pay | Admitting: General Surgery

## 2015-05-31 ENCOUNTER — Observation Stay (HOSPITAL_COMMUNITY)
Admission: EM | Admit: 2015-05-31 | Discharge: 2015-06-03 | Disposition: A | Discharge status: Home / Self Care | Payer: BLUE CROSS/BLUE SHIELD | Attending: Internal Medicine | Admitting: Internal Medicine

## 2015-05-31 ENCOUNTER — Encounter (HOSPITAL_COMMUNITY): Payer: Self-pay | Admitting: Emergency Medicine

## 2015-05-31 ENCOUNTER — Emergency Department (HOSPITAL_COMMUNITY): Payer: BLUE CROSS/BLUE SHIELD

## 2015-05-31 DIAGNOSIS — H5441 Blindness, right eye, normal vision left eye: Secondary | ICD-10-CM | POA: Insufficient documentation

## 2015-05-31 DIAGNOSIS — F329 Major depressive disorder, single episode, unspecified: Secondary | ICD-10-CM | POA: Diagnosis not present

## 2015-05-31 DIAGNOSIS — R1110 Vomiting, unspecified: Secondary | ICD-10-CM

## 2015-05-31 DIAGNOSIS — R739 Hyperglycemia, unspecified: Secondary | ICD-10-CM | POA: Diagnosis not present

## 2015-05-31 DIAGNOSIS — Z79899 Other long term (current) drug therapy: Secondary | ICD-10-CM | POA: Insufficient documentation

## 2015-05-31 DIAGNOSIS — K5710 Diverticulosis of small intestine without perforation or abscess without bleeding: Secondary | ICD-10-CM | POA: Diagnosis not present

## 2015-05-31 DIAGNOSIS — I10 Essential (primary) hypertension: Secondary | ICD-10-CM | POA: Diagnosis present

## 2015-05-31 DIAGNOSIS — R918 Other nonspecific abnormal finding of lung field: Secondary | ICD-10-CM | POA: Diagnosis not present

## 2015-05-31 DIAGNOSIS — E860 Dehydration: Secondary | ICD-10-CM | POA: Diagnosis not present

## 2015-05-31 DIAGNOSIS — I160 Hypertensive urgency: Secondary | ICD-10-CM | POA: Insufficient documentation

## 2015-05-31 DIAGNOSIS — Z87820 Personal history of traumatic brain injury: Secondary | ICD-10-CM | POA: Insufficient documentation

## 2015-05-31 DIAGNOSIS — D751 Secondary polycythemia: Secondary | ICD-10-CM | POA: Diagnosis not present

## 2015-05-31 DIAGNOSIS — N281 Cyst of kidney, acquired: Secondary | ICD-10-CM | POA: Insufficient documentation

## 2015-05-31 DIAGNOSIS — M549 Dorsalgia, unspecified: Secondary | ICD-10-CM | POA: Insufficient documentation

## 2015-05-31 DIAGNOSIS — E279 Disorder of adrenal gland, unspecified: Secondary | ICD-10-CM | POA: Insufficient documentation

## 2015-05-31 DIAGNOSIS — R1084 Generalized abdominal pain: Secondary | ICD-10-CM | POA: Diagnosis present

## 2015-05-31 DIAGNOSIS — R112 Nausea with vomiting, unspecified: Secondary | ICD-10-CM | POA: Diagnosis not present

## 2015-05-31 DIAGNOSIS — F419 Anxiety disorder, unspecified: Secondary | ICD-10-CM | POA: Diagnosis not present

## 2015-05-31 DIAGNOSIS — F17210 Nicotine dependence, cigarettes, uncomplicated: Secondary | ICD-10-CM | POA: Insufficient documentation

## 2015-05-31 DIAGNOSIS — E876 Hypokalemia: Secondary | ICD-10-CM | POA: Diagnosis not present

## 2015-05-31 DIAGNOSIS — H9191 Unspecified hearing loss, right ear: Secondary | ICD-10-CM | POA: Insufficient documentation

## 2015-05-31 LAB — CBC
HEMATOCRIT: 53.2 % — AB (ref 36.0–46.0)
Hemoglobin: 18.2 g/dL — ABNORMAL HIGH (ref 12.0–15.0)
MCH: 32.1 pg (ref 26.0–34.0)
MCHC: 34.2 g/dL (ref 30.0–36.0)
MCV: 93.8 fL (ref 78.0–100.0)
PLATELETS: 369 10*3/uL (ref 150–400)
RBC: 5.67 MIL/uL — AB (ref 3.87–5.11)
RDW: 13.6 % (ref 11.5–15.5)
WBC: 9.6 10*3/uL (ref 4.0–10.5)

## 2015-05-31 LAB — URINE MICROSCOPIC-ADD ON
BACTERIA UA: NONE SEEN
WBC, UA: NONE SEEN WBC/hpf (ref 0–5)

## 2015-05-31 LAB — URINALYSIS, ROUTINE W REFLEX MICROSCOPIC
Bilirubin Urine: NEGATIVE
GLUCOSE, UA: NEGATIVE mg/dL
Ketones, ur: NEGATIVE mg/dL
LEUKOCYTES UA: NEGATIVE
Nitrite: NEGATIVE
PH: 7.5 (ref 5.0–8.0)
Protein, ur: NEGATIVE mg/dL
SPECIFIC GRAVITY, URINE: 1.01 (ref 1.005–1.030)

## 2015-05-31 LAB — COMPREHENSIVE METABOLIC PANEL
ALT: 24 U/L (ref 14–54)
AST: 25 U/L (ref 15–41)
Albumin: 5.2 g/dL — ABNORMAL HIGH (ref 3.5–5.0)
Alkaline Phosphatase: 78 U/L (ref 38–126)
Anion gap: 14 (ref 5–15)
BUN: 13 mg/dL (ref 6–20)
CHLORIDE: 102 mmol/L (ref 101–111)
CO2: 25 mmol/L (ref 22–32)
CREATININE: 0.61 mg/dL (ref 0.44–1.00)
Calcium: 10 mg/dL (ref 8.9–10.3)
GFR calc non Af Amer: >60 mL/min (ref >60)
Glucose, Bld: 141 mg/dL — ABNORMAL HIGH (ref 65–99)
POTASSIUM: 3.7 mmol/L (ref 3.5–5.1)
SODIUM: 141 mmol/L (ref 135–145)
Total Bilirubin: 1 mg/dL (ref 0.3–1.2)
Total Protein: 8.3 g/dL — ABNORMAL HIGH (ref 6.5–8.1)

## 2015-05-31 LAB — DIFFERENTIAL
Basophils Absolute: 0 10*3/uL (ref 0.0–0.1)
Basophils Relative: 0 %
EOS PCT: 0 %
Eosinophils Absolute: 0 10*3/uL (ref 0.0–0.7)
LYMPHS ABS: 1 10*3/uL (ref 0.7–4.0)
LYMPHS PCT: 11 %
MONO ABS: 0.5 10*3/uL (ref 0.1–1.0)
MONOS PCT: 5 %
NEUTROS ABS: 7.8 10*3/uL — AB (ref 1.7–7.7)
Neutrophils Relative %: 84 %

## 2015-05-31 LAB — POC OCCULT BLOOD, ED: FECAL OCCULT BLD: POSITIVE — AB

## 2015-05-31 MED ORDER — KETOROLAC TROMETHAMINE 30 MG/ML IJ SOLN
30.0000 mg | Freq: Once | INTRAMUSCULAR | Status: AC
  Administered 2015-05-31: 30 mg via INTRAVENOUS
  Filled 2015-05-31: qty 1

## 2015-05-31 MED ORDER — IOHEXOL 300 MG/ML  SOLN
100.0000 mL | Freq: Once | INTRAMUSCULAR | Status: AC | PRN
  Administered 2015-05-31: 100 mL via INTRAVENOUS

## 2015-05-31 MED ORDER — ONDANSETRON HCL 4 MG/2ML IJ SOLN
4.0000 mg | Freq: Once | INTRAMUSCULAR | Status: AC | PRN
  Administered 2015-05-31: 4 mg via INTRAVENOUS
  Filled 2015-05-31: qty 2

## 2015-05-31 MED ORDER — ONDANSETRON HCL 4 MG PO TABS: 4.0000 mg | ORAL_TABLET | Freq: Four times a day (QID) | ORAL | Status: DC

## 2015-05-31 MED ORDER — IOHEXOL 300 MG/ML  SOLN
25.0000 mL | Freq: Once | INTRAMUSCULAR | Status: AC | PRN
  Administered 2015-05-31: 50 mL via ORAL

## 2015-05-31 MED ORDER — SODIUM CHLORIDE 0.9 % IV BOLUS (SEPSIS)
1000.0000 mL | Freq: Once | INTRAVENOUS | Status: AC
Start: 2015-05-31 — End: 2015-06-01
  Administered 2015-05-31: 1000 mL via INTRAVENOUS

## 2015-05-31 MED ORDER — METRONIDAZOLE 500 MG PO TABS
500.0000 mg | ORAL_TABLET | Freq: Once | ORAL | Status: AC
  Administered 2015-05-31: 500 mg via ORAL
  Filled 2015-05-31: qty 1

## 2015-05-31 MED ORDER — NAPROXEN 500 MG PO TABS: 500.0000 mg | ORAL_TABLET | Freq: Two times a day (BID) | ORAL | Status: DC

## 2015-05-31 NOTE — ED Notes (Signed)
Pt back from x-ray.

## 2015-05-31 NOTE — Discharge Instructions (Signed)
Abdominal Pain, Adult °Many things can cause abdominal pain. Usually, abdominal pain is not caused by a disease and will improve without treatment. It can often be observed and treated at home. Your health care provider will do a physical exam and possibly order blood tests and X-rays to help determine the seriousness of your pain. However, in many cases, more time must pass before a clear cause of the pain can be found. Before that point, your health care provider may not know if you need more testing or further treatment. °HOME CARE INSTRUCTIONS °Monitor your abdominal pain for any changes. The following actions may help to alleviate any discomfort you are experiencing: °· Only take over-the-counter or prescription medicines as directed by your health care provider. °· Do not take laxatives unless directed to do so by your health care provider. °· Try a clear liquid diet (broth, tea, or water) as directed by your health care provider. Slowly move to a bland diet as tolerated. °SEEK MEDICAL CARE IF: °· You have unexplained abdominal pain. °· You have abdominal pain associated with nausea or diarrhea. °· You have pain when you urinate or have a bowel movement. °· You experience abdominal pain that wakes you in the night. °· You have abdominal pain that is worsened or improved by eating food. °· You have abdominal pain that is worsened with eating fatty foods. °· You have a fever. °SEEK IMMEDIATE MEDICAL CARE IF: °· Your pain does not go away within 2 hours. °· You keep throwing up (vomiting). °· Your pain is felt only in portions of the abdomen, such as the right side or the left lower portion of the abdomen. °· You pass bloody or black tarry stools. °MAKE SURE YOU: °· Understand these instructions. °· Will watch your condition. °· Will get help right away if you are not doing well or get worse. °  °This information is not intended to replace advice given to you by your health care provider. Make sure you discuss  any questions you have with your health care provider. °  °Document Released: 02/08/2005 Document Revised: 01/20/2015 Document Reviewed: 01/08/2013 °Elsevier Interactive Patient Education ©2016 Elsevier Inc. ° °Nausea, Adult °Nausea is the feeling that you have an upset stomach or have to vomit. Nausea by itself is not likely a serious concern, but it may be an early sign of more serious medical problems. As nausea gets worse, it can lead to vomiting. If vomiting develops, there is the risk of dehydration.  °CAUSES  °· Viral infections. °· Food poisoning. °· Medicines. °· Pregnancy. °· Motion sickness. °· Migraine headaches. °· Emotional distress. °· Severe pain from any source. °· Alcohol intoxication. °HOME CARE INSTRUCTIONS °· Get plenty of rest. °· Ask your caregiver about specific rehydration instructions. °· Eat small amounts of food and sip liquids more often. °· Take all medicines as told by your caregiver. °SEEK MEDICAL CARE IF: °· You have not improved after 2 days, or you get worse. °· You have a headache. °SEEK IMMEDIATE MEDICAL CARE IF:  °· You have a fever. °· You faint. °· You keep vomiting or have blood in your vomit. °· You are extremely weak or dehydrated. °· You have dark or bloody stools. °· You have severe chest or abdominal pain. °MAKE SURE YOU: °· Understand these instructions. °· Will watch your condition. °· Will get help right away if you are not doing well or get worse. °  °This information is not intended to replace advice given to   you by your health care provider. Make sure you discuss any questions you have with your health care provider.   Document Released: 06/08/2004 Document Revised: 05/22/2014 Document Reviewed: 01/11/2011 Elsevier Interactive Patient Education 2016 [employer]  Emergency Department Resource Guide 1) Find a Doctor and Pay Out of Pocket Although you won't have to find out who is covered by your insurance plan, it is a good idea to ask around and get  recommendations. You will then need to call the office and see if the doctor you have chosen will accept you as a new patient and what types of options they offer for patients who are self-pay. Some doctors offer discounts or will set up payment plans for their patients who do not have insurance, but you will need to ask so you aren't surprised when you get to your appointment.  2) Contact Your Local Health Department Not all health departments have doctors that can see patients for sick visits, but many do, so it is worth a call to see if yours does. If you don't know where your local health department is, you can check in your phone book. The CDC also has a tool to help you locate your state's health department, and many state websites also have listings of all of their local health departments.  3) Find a Walk-in Clinic If your illness is not likely to be very severe or complicated, you may want to try a walk in clinic. These are popping up all over the country in pharmacies, drugstores, and shopping centers. They're usually staffed by nurse practitioners or physician assistants that have been trained to treat common illnesses and complaints. They're usually fairly quick and inexpensive. However, if you have serious medical issues or chronic medical problems, these are probably not your best option.  No Primary Care Doctor: - Call Health Connect at  [phone] - they can help you locate a primary care doctor that  accepts your insurance, provides certain services, etc. - Physician Referral Service- [phone]  Chronic Pain Problems: Organization         Address  Phone   Notes  [facility]  [phone] Patients need to be referred by their primary care doctor.   Medication Assistance: Organization         Address  Phone   Notes  [facility] Med[facility] [address] [place], [place] [address] [phone]) [phone] --Must be a resident of  [place] [facility] [place] -- Must have NO insurance coverage whatsoever (no Medicaid/ Medicare, etc.) -- The pt. MUST have a primary care doctor that directs their care regularly and follows them in the community   MedAssist  [phone]   [employer]  [phone]    Agencies that provide inexpensive medical care: Organization         Address  Phone   Notes  [facility]  [phone]   [facility]    [phone]   [facility] [address] [place], [place] [address] [phone]   [facility] [address], [place] [phone]) [phone]   Planned Parenthood    [phone]   [facility]    [phone]) [phone]   [facility]  [address], [place] Phone:  [phone](336) [phone], Fax:  [phone] Hours of Operation:  9 am - 6 pm, M-F.  Also accepts Medicaid/Medicare and self-pay.  [facility]  [place] for [place], [address], [place] Phone: [phone], Fax: [phone]. Hours of Operation:  8:30 am - 5:30 pm, M-F.  Also accepts Medicaid and self-pay.  [facility] [place] [facility] [address] [place] Phone: [phone]   [place], [place], [place] [phone], Ext. [phone] Mondays & Thursdays: 7-9 AM.  First 15 patients are seen on a first come, first serve basis.    [place] Providers:  Organization         Address  Phone   Notes  [facility] [place] [address], [place] [phone] Also accepts self-pay patients.  [facility] [address], [place]  [phone]   [place], [address], [place] [phone]   [facility] [address], [place] [phone]   Lucianne Lei [address], [place]   [phone] Only accepts [place] Access  [place] patients after they have their name applied to their card.   Self-Pay (no insurance) in [facility]:  Organization         Address  Phone   Notes  Sickle Cell Patients, [facility] [place] [phone]   [facility] [place] [phone]   [facility] [place]  [place], [place], [place] [phone]   [facility]/Dr. Osei-Bonsu  [address], [place] or [address], [place] [phone] Phone number for both [place] and [place] locations is the same.  [facility] [address], [place] [phone]   [facility] [address], [place] or [address] [phone]   [facility] [address], [place] [phone], phone; [phone], fax Sees patients 1st and 3rd Saturday of every month.  Must not qualify for public or private insurance (i.e. Medicaid, Medicare, [place] Health Choice, Veterans' Benefits)  Household income should be no more than 200% of the poverty level The clinic cannot treat you if you are pregnant or think you are pregnant  Sexually transmitted diseases are not treated at the clinic.    Dental Care: Organization         Address  Phone  Notes  [facility] [place] [phone] Accepts children up to age 24 who are enrolled in [place] or [place]; pregnant women with a Medicaid card; and children who have applied for Medicaid or [place] Health Choice, but were declined, whose parents can pay a reduced fee at time of service.  [facility]  [address], [place] [phone] Accepts children up to age 57 who are enrolled in [place] or [place]; pregnant women with a Medicaid  card; and children who have applied for Medicaid or [place] Health Choice, but were declined, whose parents can pay a reduced fee at time of service.  [facility] PRO[facility]  [place] [phone] Patients are seen by appointment only. Walk-ins are not accepted. [place] will see patients 35 years of age and older. Monday - Tuesday (8am-5pm) Most Wednesdays (8:30-5pm) $30 per visit,  cash only  [employer] Adult [employer] PROGRAM  [address], [place] [phone] Patients are seen by appointment only. Walk-ins are not accepted. [place] will see patients 41 years of age and older. One Wednesday Evening (Monthly: Volunteer Based).  $30 per visit, cash only  [place]  [phone] for adults; Children under age 10, call Graduate Pediatric Dentistry at [phone]. Children aged 71-14, please call [phone] to request a pediatric application.  Dental services are provided in all areas of dental care including fillings, crowns and bridges, complete and partial dentures, implants, gum treatment, root canals, and extractions. Preventive care is also provided. Treatment is provided to both adults and children. Patients are selected via a lottery and there is often a waiting list.   [facility] [address], [place]  [phone] [email or web address]   [facility] [address] [place], [place] [phone], Ext. [phone] Second and Fourth Thursday of each month, opens at 6:30 AM; Clinic ends at 9 AM.  Patients are seen on a first-come first-served basis, and a limited number are seen during each clinic.   [facility]  [address] Hillard Danker [place], [place] [phone]   Eligibility Requirements You must have lived in [place], [place], or [place] counties for at least the last three months.   You cannot be eligible for state or federal sponsored [employer],  including [employer], [place], or [employer].   You generally cannot be eligible for healthcare insurance through your employer.    How to apply: Eligibility screenings are held every Tuesday and Wednesday afternoon from 1:00 pm until 4:00 pm. You do not need an appointment for the interview!  [facility] [address], [place], [place]   [place]  [place] Department  [place]  [phone]    Behavioral Health Resources in the Community: Intensive Outpatient Programs Organization         Address  Phone  Notes  [place]. [address], [place], [place] [phone]   [facility] Outpatient [address] [place], [place]   ADS: [facility] Drug S[facility] [address], [place], [place]   [place] [address]  [place], [place] or [phone]   Substance Abuse Resources Organization         Address  Phone  Notes  Alcohol and Drug Services  [phone]   [place]  [phone]   The [place]   Chinita Pester  [phone]   Residential & Outpatient Substance Abuse Program  [phone]   Psychological Services Organization         Address  Phone  Notes  [facility] [place]  [place]  [phone]   [place] [address], [place] or [phone]    Mobile Crisis Teams Organization         Address  Phone  Notes  [facility], Mobile Crisis Care Unit  [phone]   [facility]  [address] [place], [place]   [place] Levels [address], [place] [phone]    Self-Help/Support Groups Organization         Address  Phone  Notes  [facility]ental [facility] Assoc[facility]  of [place] - variety of support groups  [place] Call for more information  Narcotics Anonymous (NA), [facility] [address], [employer] [place]  2 meetings at this location   Special educational needs teacher         Address  Phone  Notes  [facility] Residential Treatment [place],    [place]  [phone]   [facility]  [address], [place] [address]471, [place], [place]   Clyde Nibley, [place] [phone] Admissions: 8am-3pm M-F  Incentives Substance [place] [address],    [place], [place] [phone]   [facility] [address] [place], [place], [place]   [facility] [place] [address]  [place], [place]   [facility] - Intensive Outpatient [address], [place], [place]   [facility] [place]  [place], [place] [phone] or [phone]   [facility] (RTS) [address], [place], [place] Accepts Medicaid  [facility] [place] [address]  [place] [phone] Substance Abuse/Addiction Treatment   [facility] Organization         Address  Phone  Notes  [facility]  [phone]   Domenic Schwab, PhD [address] Arlis [place], [place]   [phone] or [phone]   [place], [place] [phone]   [facility] [address], [place], [place] [phone] Insurance/Medicaid/sponsorship through [facility] and Families [address], [address] [place]                                    [place], [place] [phone] [place] [address], [place] [phone]    Dr. Adele Schilder  [phone]   [facility] Dept. 1) [address] [place] 2)  [place] 3)  [address], [place] [phone]  [phone]   [place] [phone] or [phone] (After Hours)

## 2015-05-31 NOTE — ED Notes (Signed)
Pt c/o exacerbation of recurrent diffuse abdominal pain with emesis. No diarrhea.

## 2015-05-31 NOTE — ED Provider Notes (Signed)
CSN: [id number]     Arrival date & time 05/31/15  1348 History   First MD Initiated Contact with Patient 05/31/15 1944     Chief Complaint  Patient presents with  . Emesis  . Abdominal Pain     (Consider location/radiation/quality/duration/timing/severity/associated sxs/prior Treatment) HPI   Cristiana Yochim is a 60 y.o. female with PMH significant for depression, anxiety, HTN, TBI, deafness in right ear, right eye blindness who presents with generalized abdominal pain.  Patient was recently seen 05/11/15 and admitted for abdominal pain with N/V.  CT revealed intussusception in the ED.  She then underwent exploratory laparotomy for further evaluation which was unremarkable.  Upon discharge, she was instructed to follow up with GI for small bowel capsule study.  She reports her abdominal pain never fully went away, and she has been dealing with it for the past 3 weeks.  It became worse today, which prompted her ED visit.  She reports one episode of NBNB emesis today as well.  The pain is constant, generalized, and moderate.  She takes phenergan for nausea.  She also reports one episode of BR bloody stools today.  Denies fever, CP, SOB, or urinary symptoms.   Past Medical History  Diagnosis Date  . Depressed   . Hypertension   . Anxiety   . TBI (traumatic brain injury) (HCC)   . Deafness in right ear   . Blind right eye    Past Surgical History  Procedure Laterality Date  . Laparoscopy N/A 05/14/2015    Procedure: LAPAROSCOPY DIAGNOSTIC;  Surgeon: Emelia Loron, MD;  Location: [facility] OR;  Service: General;  Laterality: N/A;   History reviewed. No pertinent family history. Social History  Substance Use Topics  . Smoking status: Current Every Day Smoker -- 1.00 packs/day    Types: Cigarettes  . Smokeless tobacco: None  . Alcohol Use: No   OB History    No data available     Review of Systems All other systems negative unless otherwise stated in HPI  Allergies   Zithromax  Home Medications   Prior to Admission medications   Medication Sig Start Date End Date Taking? Authorizing Provider  ALPRAZolam Prudy Feeler) 1 MG tablet Take 1 mg by mouth 3 (three) times daily as needed for anxiety.    Yes Historical Provider, MD  hydrochlorothiazide (HYDRODIURIL) 25 MG tablet Take 25 mg by mouth daily.   Yes Historical Provider, MD  promethazine (PHENERGAN) 25 MG tablet Take 25 mg by mouth every 6 (six) hours as needed for nausea or vomiting.   Yes Historical Provider, MD  sertraline (ZOLOFT) 100 MG tablet Take 150 mg by mouth daily.   Yes Historical Provider, MD  tetrahydrozoline 0.05 % ophthalmic solution Place 1 drop into both eyes 2 (two) times daily as needed. For dry eyes   Yes Historical Provider, MD  Vitamin D, Ergocalciferol, (DRISDOL) 50000 units CAPS capsule Take 50,000 Units by mouth every 7 (seven) days.   Yes Historical Provider, MD  famotidine (PEPCID) 20 MG tablet Take 1 tablet (20 mg total) by mouth 2 (two) times daily. Patient not taking: Reported on 05/31/2015 05/15/15   Nita Sells Mikhail, DO  naproxen (NAPROSYN) 500 MG tablet Take 1 tablet (500 mg total) by mouth 2 (two) times daily. 05/31/15   Jaylyn Booher, PA-C  ondansetron (ZOFRAN) 4 MG tablet Take 1 tablet (4 mg total) by mouth every 6 (six) hours. 05/31/15   Cheri Fowler, PA-C  oxyCODONE-acetaminophen (PERCOCET/ROXICET) 5-325 MG tablet Take 1-2 tablets by mouth  every 4 (four) hours as needed for moderate pain or severe pain. Patient not taking: Reported on 05/31/2015 05/15/15   Maryann Mikhail, DO   BP 180/108 mmHg  Pulse 99  Temp(Src) 98 F (36.7 C) (Oral)  Resp 16  SpO2 95% Physical Exam  Constitutional: She is oriented to person, place, and time. She appears well-developed and well-nourished.  HENT:  Head: Normocephalic and atraumatic.  Mouth/Throat: Oropharynx is clear and moist.  Eyes: Conjunctivae are normal. Pupils are equal, round, and reactive to light.  Neck: Normal range of motion.  Neck supple.  Cardiovascular: Normal rate, regular rhythm and normal heart sounds.   No murmur heard. Pulmonary/Chest: Effort normal and breath sounds normal. No accessory muscle usage or stridor. No respiratory distress. She has no wheezes. She has no rhonchi. She has no rales.  Abdominal: Soft. She exhibits no distension. Bowel sounds are decreased. There is generalized tenderness. There is no rigidity, no rebound and no guarding.  Musculoskeletal: Normal range of motion.  Lymphadenopathy:    She has no cervical adenopathy.  Neurological: She is alert and oriented to person, place, and time.  Speech clear without dysarthria.  Skin: Skin is warm and dry.  Psychiatric: She has a normal mood and affect. Her behavior is normal.    ED Course  Procedures (including critical care time) Labs Review Labs Reviewed  COMPREHENSIVE METABOLIC PANEL - Abnormal; Notable for the following:    Glucose, Bld 141 (*)    Total Protein 8.3 (*)    Albumin 5.2 (*)    All other components within normal limits  CBC - Abnormal; Notable for the following:    RBC 5.67 (*)    Hemoglobin 18.2 (*)    HCT 53.2 (*)    All other components within normal limits  URINALYSIS, ROUTINE W REFLEX MICROSCOPIC (NOT [facility]) - Abnormal; Notable for the following:    APPearance TURBID (*)    Hgb urine dipstick TRACE (*)    All other components within normal limits  DIFFERENTIAL - Abnormal; Notable for the following:    Neutro Abs 7.8 (*)    All other components within normal limits  URINE MICROSCOPIC-ADD ON - Abnormal; Notable for the following:    Squamous Epithelial / LPF 0-5 (*)    All other components within normal limits  POC OCCULT BLOOD, ED - Abnormal; Notable for the following:    Fecal Occult Bld POSITIVE (*)    All other components within normal limits  CBC WITH DIFFERENTIAL/PLATELET  OCCULT BLOOD X 1 CARD TO LAB, STOOL    Imaging Review Ct Abdomen Pelvis W Contrast  05/31/2015  CLINICAL DATA:  Acute  onset of generalized abdominal pain and vomiting. Initial encounter. EXAM: CT ABDOMEN AND PELVIS WITH CONTRAST TECHNIQUE: Multidetector CT imaging of the abdomen and pelvis was performed using the standard protocol following bolus administration of intravenous contrast. CONTRAST:  100mL OMNIPAQUE IOHEXOL 300 MG/ML  SOLN COMPARISON:  CT of the abdomen and pelvis from 05/11/2015 and MR enteroclysis performed 05/12/2015 FINDINGS: The visualized lung bases are clear. The liver and spleen are unremarkable in appearance. The gallbladder is within normal limits. The pancreas is unremarkable in appearance. Bilateral adrenal nodules are noted, measuring up to 2.1 cm on the right and 1.8 cm on the left. Small bilateral renal cysts are seen, measuring up to 1.2 cm in size. The kidneys are otherwise unremarkable. There is no evidence of hydronephrosis. No renal or ureteral stones are seen. No perinephric stranding is appreciated. No  free fluid is identified. The small bowel is unremarkable in appearance. A small duodenal diverticulum is noted at the pancreatic head. The stomach is within normal limits. No acute vascular abnormalities are seen. Minimal calcification is noted along the abdominal aorta and its branches. Mild postoperative stranding is noted below the umbilicus. The appendix is normal in caliber, without evidence of appendicitis. Contrast progresses to the level of the descending colon. There is suggestion of mucosal edema along the sigmoid colon, with underlying diffuse diverticulosis. This could reflect a mild infectious or inflammatory process, depending on the patient's symptoms. The bladder is mildly distended and grossly unremarkable. Heterogeneity is again noted along the endometrial canal. As recommended on prior MRI, pelvic ultrasound is recommended for further evaluation. The ovaries are grossly symmetric. No suspicious adnexal masses are seen. No inguinal lymphadenopathy is seen. No acute osseous  abnormalities are identified. 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 Endplate sclerotic change and vacuum phenomenon are noted at L5-S1. Endplate sclerotic change and vacuum phenomenon are also seen at T12-L1. IMPRESSION: 1. Suggestion of mucosal edema along the sigmoid colon, with underlying diffuse diverticulosis. This could reflect a mild infectious or inflammatory process, depending on the patient's symptoms, or could remain within normal limits. 2. Heterogeneity again noted within the endometrial canal. As recommended on recent prior MRI, pelvic ultrasound is recommended for further evaluation on an elective nonemergent basis, to exclude underlying mass. 3. Bilateral adrenal nodules again noted, measuring 2.1 cm on the right and 1.8 cm on the left. These were seen to reflect adrenal adenomas on the prior MRI. 4. Small bilateral renal cysts seen. 5. Small duodenal diverticulum at the pancreatic head. 6. 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 Electronically Signed   By: Roanna Raider M.D.   On: 05/31/2015 23:17   Dg Abd Acute W/chest  05/31/2015  CLINICAL DATA:  60 year old female with diffuse abdominal pain and emesis for 2 weeks. Laparoscopy on 05/14/2015. EXAM: DG ABDOMEN ACUTE W/ 1V CHEST COMPARISON:  No priors. FINDINGS: Well circumscribed 13 x 16 mm nodule projecting over the right mid lung just beneath the minor fissure. No acute consolidative airspace disease. No pleural effusions. No pneumothorax. No evidence of pulmonary edema. Heart size is normal. Atherosclerosis in the thoracic aorta. Upper mediastinal contours are remarkable for prominence of the right paratracheal soft tissues which may be vascular, however, underlying adenopathy is not excluded. Alternatively, this could be related to enlarged right lobe of the thyroid gland. Gas and stool are seen scattered throughout the colon extending to  the level of the distal rectum. No pathologic distension of small bowel is noted. No gross evidence of pneumoperitoneum. IMPRESSION: 1.  Nonobstructive bowel gas pattern. 2. No pneumoperitoneum. 3. Smoothly marginated 1.3 x 1.6 cm nodule in the right mid lung may be within either the right middle lobe or right lower lobe. No prior studies are available for comparison. The possibility of a primary neoplasm or metastatic lesion should be considered, and further evaluation with contrast enhanced chest CT is recommended in the near future for further evaluation. At the time of the chest CT, attention of right paratracheal region is also recommended to evaluate for potential lymphadenopathy. Electronically Signed   By: Trudie Reed M.D.   On: 05/31/2015 21:23   I have personally reviewed and evaluated these images and lab results as part of my medical decision-making.   EKG Interpretation None  MDM   Final diagnoses:  Generalized abdominal pain  Intractable vomiting with nausea, vomiting of unspecified type    Patient presents with abdominal pain that is similar to previous ED visit.  No fever.  VSS, NAD.  On exam, diffuse abdominal tenderness without rebound, guarding, or rigidity.  Will obtain labs and give fluids.  CMP unremarkable CBC remarkable for HGB 18.2.   UA negative Plain film of abdomen remarkable for Smoothly marginated 1.3 x 1.6 cm nodule in the right mid lung maybe within either the right middle lobe or right lower lobe. No prior studies are available for comparison. The possibility of a primary neoplasm or metastatic lesion should be considered, and further evaluation with contrast enhanced chest CT is recommended in the near future for further evaluation. At the time of the chest CT, attention of right paratracheal region is also recommended to evaluate for potential lymphadenopathy. Fecal occult positive.  Will obtain CT abdomen/pelvis.  CT abdomen pelvis remarkable for  suggestion of mucosal edema along with colon with underlying diffuse diverticulosis. This could reflect a mild infectious or inflammatory process, depending on the patient's symptoms.Heterogeneity again noted within the endometrial canal. As recommended on recent prior MRI, pelvic ultrasound is recommended for further evaluation on an elective nonemergent basis, to exclude underlying mass. 3. Bilateral adrenal nodules again noted, measuring 2.1 cm on the right and 1.8 cm on the left. These were seen to reflect adrenal adenomas on the prior MRI. 4. Small bilateral renal cysts seen. 5. Small duodenal diverticulum at the pancreatic head. 6. 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  After patient had been marked for discharge I was informed she had 3 episodes of emesis and her pain is not controlled.  Will admit for intractable nausea and vomiting and pain control.  Case has been discussed with Dr. Criss Alvine who agrees with the above plan for admission.      Cheri Fowler, PA-C 06/01/15 1610  Pricilla Loveless, MD 06/02/15 [phone]

## 2015-05-31 NOTE — ED Notes (Signed)
Lab adding on differential to CBC

## 2015-06-01 ENCOUNTER — Encounter (HOSPITAL_COMMUNITY): Payer: Self-pay | Admitting: Family Medicine

## 2015-06-01 DIAGNOSIS — R1084 Generalized abdominal pain: Principal | ICD-10-CM

## 2015-06-01 DIAGNOSIS — R112 Nausea with vomiting, unspecified: Secondary | ICD-10-CM | POA: Diagnosis present

## 2015-06-01 DIAGNOSIS — E860 Dehydration: Secondary | ICD-10-CM | POA: Diagnosis not present

## 2015-06-01 DIAGNOSIS — I10 Essential (primary) hypertension: Secondary | ICD-10-CM | POA: Diagnosis not present

## 2015-06-01 DIAGNOSIS — R1110 Vomiting, unspecified: Secondary | ICD-10-CM

## 2015-06-01 DIAGNOSIS — D751 Secondary polycythemia: Secondary | ICD-10-CM | POA: Diagnosis present

## 2015-06-01 LAB — CBC WITH DIFFERENTIAL/PLATELET
BASOS PCT: 0 %
Basophils Absolute: 0 10*3/uL (ref 0.0–0.1)
EOS ABS: 0 10*3/uL (ref 0.0–0.7)
Eosinophils Relative: 0 %
HCT: 44.6 % (ref 36.0–46.0)
HEMOGLOBIN: 14.6 g/dL (ref 12.0–15.0)
Lymphocytes Relative: 12 %
Lymphs Abs: 0.9 10*3/uL (ref 0.7–4.0)
MCH: 30.7 pg (ref 26.0–34.0)
MCHC: 32.7 g/dL (ref 30.0–36.0)
MCV: 93.9 fL (ref 78.0–100.0)
MONOS PCT: 10 %
Monocytes Absolute: 0.8 10*3/uL (ref 0.1–1.0)
NEUTROS PCT: 78 %
Neutro Abs: 6 10*3/uL (ref 1.7–7.7)
Platelets: 298 10*3/uL (ref 150–400)
RBC: 4.75 MIL/uL (ref 3.87–5.11)
RDW: 13.7 % (ref 11.5–15.5)
WBC: 7.8 10*3/uL (ref 4.0–10.5)

## 2015-06-01 LAB — COMPREHENSIVE METABOLIC PANEL
ALT: 17 U/L (ref 14–54)
ANION GAP: 11 (ref 5–15)
AST: 15 U/L (ref 15–41)
Albumin: 3.8 g/dL (ref 3.5–5.0)
Alkaline Phosphatase: 54 U/L (ref 38–126)
BUN: 9 mg/dL (ref 6–20)
CHLORIDE: 106 mmol/L (ref 101–111)
CO2: 23 mmol/L (ref 22–32)
Calcium: 8.5 mg/dL — ABNORMAL LOW (ref 8.9–10.3)
Creatinine, Ser: 0.47 mg/dL (ref 0.44–1.00)
GFR calc non Af Amer: >60 mL/min (ref >60)
Glucose, Bld: 125 mg/dL — ABNORMAL HIGH (ref 65–99)
Potassium: 3.4 mmol/L — ABNORMAL LOW (ref 3.5–5.1)
SODIUM: 140 mmol/L (ref 135–145)
Total Bilirubin: 0.9 mg/dL (ref 0.3–1.2)
Total Protein: 6 g/dL — ABNORMAL LOW (ref 6.5–8.1)

## 2015-06-01 LAB — LACTIC ACID, PLASMA
LACTIC ACID, VENOUS: 0.6 mmol/L (ref 0.5–2.0)
Lactic Acid, Venous: 1.1 mmol/L (ref 0.5–2.0)

## 2015-06-01 LAB — GLUCOSE, CAPILLARY: GLUCOSE-CAPILLARY: 135 mg/dL — AB (ref 65–99)

## 2015-06-01 LAB — TSH: TSH: 0.374 u[IU]/mL (ref 0.350–4.500)

## 2015-06-01 MED ORDER — POTASSIUM CHLORIDE IN NACL 20-0.9 MEQ/L-% IV SOLN
INTRAVENOUS | Status: DC
Start: 2015-06-01 — End: 2015-06-02
  Administered 2015-06-01 (×2): via INTRAVENOUS
  Filled 2015-06-01 (×2): qty 1000

## 2015-06-01 MED ORDER — ACETAMINOPHEN 325 MG PO TABS
650.0000 mg | ORAL_TABLET | Freq: Once | ORAL | Status: DC
  Filled 2015-06-01: qty 2

## 2015-06-01 MED ORDER — HYDROCHLOROTHIAZIDE 25 MG PO TABS
25.0000 mg | ORAL_TABLET | Freq: Every day | ORAL | Status: DC
  Administered 2015-06-01 – 2015-06-03 (×3): 25 mg via ORAL
  Filled 2015-06-01 (×3): qty 1

## 2015-06-01 MED ORDER — NAPHAZOLINE-GLYCERIN 0.012-0.2 % OP SOLN: 2.0000 [drp] | Freq: Four times a day (QID) | OPHTHALMIC | Status: DC | PRN

## 2015-06-01 MED ORDER — HYDROCODONE-ACETAMINOPHEN 5-325 MG PO TABS
1.0000 | ORAL_TABLET | ORAL | Status: DC | PRN
  Administered 2015-06-01: 1 via ORAL
  Filled 2015-06-01: qty 1

## 2015-06-01 MED ORDER — ONDANSETRON HCL 4 MG/2ML IJ SOLN
4.0000 mg | Freq: Four times a day (QID) | INTRAMUSCULAR | Status: DC | PRN
  Administered 2015-06-01: 4 mg via INTRAVENOUS
  Filled 2015-06-01: qty 2

## 2015-06-01 MED ORDER — ONDANSETRON HCL 4 MG/2ML IJ SOLN
4.0000 mg | Freq: Once | INTRAMUSCULAR | Status: AC
  Administered 2015-06-01: 4 mg via INTRAVENOUS
  Filled 2015-06-01: qty 2

## 2015-06-01 MED ORDER — HYDRALAZINE HCL 20 MG/ML IJ SOLN
10.0000 mg | INTRAMUSCULAR | Status: DC | PRN
  Administered 2015-06-01: 10 mg via INTRAVENOUS
  Filled 2015-06-01: qty 1

## 2015-06-01 MED ORDER — ONDANSETRON HCL 4 MG PO TABS: 4.0000 mg | ORAL_TABLET | Freq: Four times a day (QID) | ORAL | Status: DC | PRN

## 2015-06-01 MED ORDER — MORPHINE SULFATE (PF) 2 MG/ML IV SOLN
2.0000 mg | INTRAVENOUS | Status: DC | PRN
  Administered 2015-06-01 (×2): 2 mg via INTRAVENOUS
  Filled 2015-06-01 (×2): qty 1

## 2015-06-01 MED ORDER — SERTRALINE HCL 50 MG PO TABS
150.0000 mg | ORAL_TABLET | Freq: Every day | ORAL | Status: DC
  Administered 2015-06-01 – 2015-06-03 (×3): 150 mg via ORAL
  Filled 2015-06-01 (×3): qty 1

## 2015-06-01 MED ORDER — ACETAMINOPHEN 325 MG PO TABS
650.0000 mg | ORAL_TABLET | Freq: Four times a day (QID) | ORAL | Status: DC | PRN
  Filled 2015-06-01: qty 2

## 2015-06-01 MED ORDER — CLONIDINE HCL 0.2 MG/24HR TD PTWK
0.2000 mg | MEDICATED_PATCH | TRANSDERMAL | Status: DC
  Administered 2015-06-01: 0.2 mg via TRANSDERMAL
  Filled 2015-06-01: qty 1

## 2015-06-01 MED ORDER — PANTOPRAZOLE SODIUM 40 MG IV SOLR
40.0000 mg | Freq: Two times a day (BID) | INTRAVENOUS | Status: DC
  Administered 2015-06-01 – 2015-06-02 (×4): 40 mg via INTRAVENOUS
  Filled 2015-06-01 (×6): qty 40

## 2015-06-01 MED ORDER — BISACODYL 5 MG PO TBEC: 5.0000 mg | DELAYED_RELEASE_TABLET | Freq: Every day | ORAL | Status: DC | PRN

## 2015-06-01 MED ORDER — SODIUM CHLORIDE 0.9 % IV BOLUS (SEPSIS)
1000.0000 mL | Freq: Once | INTRAVENOUS | Status: AC
  Administered 2015-06-01: 1000 mL via INTRAVENOUS

## 2015-06-01 MED ORDER — ALPRAZOLAM 1 MG PO TABS
1.0000 mg | ORAL_TABLET | Freq: Three times a day (TID) | ORAL | Status: DC | PRN
  Administered 2015-06-01 – 2015-06-03 (×4): 1 mg via ORAL
  Filled 2015-06-01 (×4): qty 1

## 2015-06-01 MED ORDER — PROMETHAZINE HCL 25 MG PO TABS
25.0000 mg | ORAL_TABLET | Freq: Four times a day (QID) | ORAL | Status: DC | PRN
  Administered 2015-06-01: 25 mg via ORAL
  Filled 2015-06-01: qty 1

## 2015-06-01 MED ORDER — SENNOSIDES-DOCUSATE SODIUM 8.6-50 MG PO TABS: 1.0000 | ORAL_TABLET | Freq: Every evening | ORAL | Status: DC | PRN

## 2015-06-01 MED ORDER — LORAZEPAM 1 MG PO TABS
1.0000 mg | ORAL_TABLET | Freq: Once | ORAL | Status: AC
  Administered 2015-06-01: 1 mg via ORAL
  Filled 2015-06-01: qty 1

## 2015-06-01 MED ORDER — ACETAMINOPHEN 650 MG RE SUPP: 650.0000 mg | Freq: Four times a day (QID) | RECTAL | Status: DC | PRN

## 2015-06-01 MED ORDER — ENOXAPARIN SODIUM 40 MG/0.4ML ~~LOC~~ SOLN: 40.0000 mg | SUBCUTANEOUS | Status: DC

## 2015-06-01 NOTE — ED Notes (Signed)
Unit asked to wait about 15 min prior to bringing pt up

## 2015-06-01 NOTE — H&P (Signed)
[facility] History and Physical  Thedora Rings ZOX:[id number] DOB: 08-10-1955 DOA: 05/31/2015  Referring physician: ED physician PCP: No primary care provider on file.  Specialists:   Chief Complaint:  Abdominal pain, nausea, vomiting  HPI: Ana Stone is a 60 y.o. female with PMH of depression and anxiety who was admitted to the hospital 3 weeks ago and found to have small bowel intussusception, now presenting with worsening abdominal pain, nausea, and vomiting. During the recent admission, laparoscopic exploration was performed with no significant findings per report. Patient was discharged in improved and stable condition but reports a recurrence in abdominal pain within a few days of leaving the hospital. She was discharged with plans to follow up with GI for capsule endoscopy but has not followed through with this as of yet. She describes her current abdominal pain as constant, severe, localized to the lower abdomen, and associated with cramping, nausea, and nonbilious non-bloody vomiting. She is unable to identify exacerbating or alleviating factors. She denies diarrhea, chest pain, palpitations, headaches, or dysuria. There's been no recent change in her medications and she denies recent alcohol use.  In ED, patient was found to be afebrile, saturating well on room air, and hypertensive to the 200/110 range. Initial blood work was largely unremarkable and a CT of the abdomen and pelvis demonstrates mild inflammatory signs at the sigmoid colon felt to represent an infectious or inflammatory process, or could even be within normal limits per radiology. Patient was treated with antibiotics and IV fluids in the emergency department but discharge home was precluded by intractable nausea and vomiting. She will be admitted to the hospital for management of intractable nausea and vomiting and further evaluation as to its etiology.  Where does patient live?   At home     Can patient  participate in ADLs?  Yes        Review of Systems:   General: no fevers, chills, sweats, weight change, poor appetite, or fatigue HEENT: no blurry vision, hearing changes or sore throat Pulm: no dyspnea, cough, or wheeze CV: no chest pain or palpitations Abd:  Abdominal pain, nausea, vomiting. No diarrhea, or constipation GU: no dysuria, hematuria, increased urinary frequency, or urgency  Ext: no leg edema Neuro: no focal weakness, numbness, or tingling, no vision change or hearing loss Skin: no rash, no wounds MSK: No muscle spasm, no deformity, no red, hot, or swollen joint Heme: No easy bruising or bleeding Travel history: No recent long distant travel    Allergy:  Allergies  Allergen Reactions  . Zithromax [Azithromycin] Rash    Past Medical History  Diagnosis Date  . Depressed   . Hypertension   . Anxiety   . TBI (traumatic brain injury) (HCC)   . Deafness in right ear   . Blind right eye     Past Surgical History  Procedure Laterality Date  . Laparoscopy N/A 05/14/2015    Procedure: LAPAROSCOPY DIAGNOSTIC;  Surgeon: Emelia Loron, MD;  Location: [facility] OR;  Service: General;  Laterality: N/A;    Social History:  reports that she has been smoking Cigarettes.  She has been smoking about 1.00 pack per day. She does not have any smokeless tobacco history on file. She reports that she does not drink alcohol. Her drug history is not on file.  Family History:  Family History  Problem Relation Age of Onset  . Hypertension Other      Prior to Admission medications   Medication Sig Start Date End Date Taking? Authorizing  Provider  ALPRAZolam Prudy Feeler) 1 MG tablet Take 1 mg by mouth 3 (three) times daily as needed for anxiety.    Yes Historical Provider, MD  hydrochlorothiazide (HYDRODIURIL) 25 MG tablet Take 25 mg by mouth daily.   Yes Historical Provider, MD  promethazine (PHENERGAN) 25 MG tablet Take 25 mg by mouth every 6 (six) hours as needed for nausea or vomiting.    Yes Historical Provider, MD  sertraline (ZOLOFT) 100 MG tablet Take 150 mg by mouth daily.   Yes Historical Provider, MD  tetrahydrozoline 0.05 % ophthalmic solution Place 1 drop into both eyes 2 (two) times daily as needed. For dry eyes   Yes Historical Provider, MD  Vitamin D, Ergocalciferol, (DRISDOL) 50000 units CAPS capsule Take 50,000 Units by mouth every 7 (seven) days.   Yes Historical Provider, MD  famotidine (PEPCID) 20 MG tablet Take 1 tablet (20 mg total) by mouth 2 (two) times daily. Patient not taking: Reported on 05/31/2015 05/15/15   Nita Sells Mikhail, DO  naproxen (NAPROSYN) 500 MG tablet Take 1 tablet (500 mg total) by mouth 2 (two) times daily. 05/31/15   Kayla Rose, PA-C  ondansetron (ZOFRAN) 4 MG tablet Take 1 tablet (4 mg total) by mouth every 6 (six) hours. 05/31/15   Cheri Fowler, PA-C  oxyCODONE-acetaminophen (PERCOCET/ROXICET) 5-325 MG tablet Take 1-2 tablets by mouth every 4 (four) hours as needed for moderate pain or severe pain. Patient not taking: Reported on 05/31/2015 05/15/15   Edsel Petrin, DO    Physical Exam: Filed Vitals:   05/31/15 1948 05/31/15 2032 05/31/15 2033 06/01/15 0018  BP: 192/111  191/113 180/108  Pulse: 94  92 99  Temp:  98.1 F (36.7 C)  98 F (36.7 C)  TempSrc:  Oral  Oral  Resp: 16   16  SpO2: 100%  96% 95%   General: Not in acute distress HEENT:       Eyes: PERRL, EOMI, no scleral icterus or conjunctival pallor.       ENT: No discharge from the ears or nose, no pharyngeal ulcers, petechiae or exudate, no tonsillar enlargement.        Neck: No JVD, no bruit, no appreciable mass Heme: No cervical adenopathy, no pallor Cardiac: S1/S2, RRR, No murmurs, No gallops or rubs. Pulm: Good air movement bilaterally. No rales, wheezing, rhonchi or rubs. Abd:   Tender in b/l lower quadrants, Lt > Rt. Soft, non-distended, no rebound pain or gaurding, no mass or organomegaly, BS present. Ext: No LE edema bilaterally. 2+DP/PT pulse  bilaterally. Musculoskeletal: No gross deformity, no red, hot, swollen joints, no limitation in ROM  Skin: No rashes or wounds on exposed surfaces  Neuro: Alert, oriented X3, cranial nerves II-XII grossly intact. No focal findings Psych: Patient is not overtly psychotic, denies suicidal or homocidal ideation, no active hallucinations.  Labs on Admission:  Basic Metabolic Panel:  Recent Labs Lab 05/31/15 1950  NA 141  K 3.7  CL 102  CO2 25  GLUCOSE 141*  BUN 13  CREATININE 0.61  CALCIUM 10.0   Liver Function Tests:  Recent Labs Lab 05/31/15 1950  AST 25  ALT 24  ALKPHOS 78  BILITOT 1.0  PROT 8.3*  ALBUMIN 5.2*   No results for input(s): LIPASE, AMYLASE in the last 168 hours. No results for input(s): AMMONIA in the last 168 hours. CBC:  Recent Labs Lab 05/31/15 1950  WBC 9.6  NEUTROABS 7.8*  HGB 18.2*  HCT 53.2*  MCV 93.8  PLT 369  Cardiac Enzymes: No results for input(s): CKTOTAL, CKMB, CKMBINDEX, TROPONINI in the last 168 hours.  BNP (last 3 results) No results for input(s): BNP in the last 8760 hours.  ProBNP (last 3 results) No results for input(s): PROBNP in the last 8760 hours.  CBG: No results for input(s): GLUCAP in the last 168 hours.  Radiological Exams on Admission: Ct Abdomen Pelvis W Contrast  05/31/2015  CLINICAL DATA:  Acute onset of generalized abdominal pain and vomiting. Initial encounter. EXAM: CT ABDOMEN AND PELVIS WITH CONTRAST TECHNIQUE: Multidetector CT imaging of the abdomen and pelvis was performed using the standard protocol following bolus administration of intravenous contrast. CONTRAST:  OMNIPAQUE IOHEXOL 300 MG/ML  SOLN COMPARISON:  CT of the abdomen and pelvis from 05/11/2015 and MR enteroclysis performed 05/12/2015 FINDINGS: The visualized lung bases are clear. The liver and spleen are unremarkable in appearance. The gallbladder is within normal limits. The pancreas is unremarkable in appearance. Bilateral adrenal  nodules are noted, measuring up to 2.1 cm on the right and 1.8 cm on the left. Small bilateral renal cysts are seen, measuring up to 1.2 cm in size. The kidneys are otherwise unremarkable. There is no evidence of hydronephrosis. No renal or ureteral stones are seen. No perinephric stranding is appreciated. No free fluid is identified. The small bowel is unremarkable in appearance. A small duodenal diverticulum is noted at the pancreatic head. The stomach is within normal limits. No acute vascular abnormalities are seen. Minimal calcification is noted along the abdominal aorta and its branches. Mild postoperative stranding is noted below the umbilicus. The appendix is normal in caliber, without evidence of appendicitis. Contrast progresses to the level of the descending colon. There is suggestion of mucosal edema along the sigmoid colon, with underlying diffuse diverticulosis. This could reflect a mild infectious or inflammatory process, depending on the patient's symptoms. The bladder is mildly distended and grossly unremarkable. Heterogeneity is again noted along the endometrial canal. As recommended on prior MRI, pelvic ultrasound is recommended for further evaluation. The ovaries are grossly symmetric. No suspicious adnexal masses are seen. No inguinal lymphadenopathy is seen. No acute osseous abnormalities are identified. 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 Endplate sclerotic change and vacuum phenomenon are noted at L5-S1. Endplate sclerotic change and vacuum phenomenon are also seen at T12-L1. IMPRESSION: 1. Suggestion of mucosal edema along the sigmoid colon, with underlying diffuse diverticulosis. This could reflect a mild infectious or inflammatory process, depending on the patient's symptoms, or could remain within normal limits. 2. Heterogeneity again noted within the endometrial canal. As recommended on recent prior MRI, pelvic ultrasound is  recommended for further evaluation on an elective nonemergent basis, to exclude underlying mass. 3. Bilateral adrenal nodules again noted, measuring 2.1 cm on the right and 1.8 cm on the left. These were seen to reflect adrenal adenomas on the prior MRI. 4. Small bilateral renal cysts seen. 5. Small duodenal diverticulum at the pancreatic head. 6. 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 Electronically Signed   By: Roanna Raider M.D.   On: 05/31/2015 23:17   Dg Abd Acute W/chest  05/31/2015  CLINICAL DATA:  60 year old female with diffuse abdominal pain and emesis for 2 weeks. Laparoscopy on 05/14/2015. EXAM: DG ABDOMEN ACUTE W/ 1V CHEST COMPARISON:  No priors. FINDINGS: Well circumscribed 13 x 16 mm nodule projecting over the right mid lung just beneath the minor fissure. No acute  consolidative airspace disease. No pleural effusions. No pneumothorax. No evidence of pulmonary edema. Heart size is normal. Atherosclerosis in the thoracic aorta. Upper mediastinal contours are remarkable for prominence of the right paratracheal soft tissues which may be vascular, however, underlying adenopathy is not excluded. Alternatively, this could be related to enlarged right lobe of the thyroid gland. Gas and stool are seen scattered throughout the colon extending to the level of the distal rectum. No pathologic distension of small bowel is noted. No gross evidence of pneumoperitoneum. IMPRESSION: 1.  Nonobstructive bowel gas pattern. 2. No pneumoperitoneum. 3. Smoothly marginated 1.3 x 1.6 cm nodule in the right mid lung may be within either the right middle lobe or right lower lobe. No prior studies are available for comparison. The possibility of a primary neoplasm or metastatic lesion should be considered, and further evaluation with contrast enhanced chest CT is recommended in the near future for further evaluation. At the time of the chest CT, attention of right paratracheal  region is also recommended to evaluate for potential lymphadenopathy. Electronically Signed   By: Trudie Reed M.D.   On: 05/31/2015 21:23    EKG:  Not done in ED, will obtain as appropriate   Assessment/Plan  1. Intractable nausea and vomiting with dehydration - Uncertain etiology  - CT reveals non-specific inflammatory signs adjacent to sigmoid colon - Has been fluid-resuscitated in ED  - Will treat with antiemetics, manage pain  - Advance diet as tolerated  - No infectious signs, watching off abx for now - Pt's underlying anxiety and depression may contribute to her presentation  2. Hypertensive urgency  - Not tolerating PO at the moment - Treating with clonidine patch and prn IV pushes of hydralazine for now - Expect improvement with treatment of #1 above  - Convert to PO agents as tolerated  - No CP or HA, no sign of end-organ damage   3. Polycythemia  - Hgb 18.2, Hct 53.2 on admission  - H/H have been consistently elevated  - Likely a component of relative polycythemia in setting of N/V with dehydration  - MCV is wnl; could be secondary to smoking, CO exposure, endocrine tumor, etc; outpt w/u can be pursued as appropriate    4. Hyperglycemia  - Serum glucose elevated on presentation with no history of DM  - Check CBGs and institute a SSI regimen as needed     DVT ppx:  Lovenox     Code Status: Full code Family Communication: None at bed side.          Disposition Plan: Admit to inpatient   Date of Service 06/01/2015    Briscoe Deutscher, MD [facility] Pager [phone]  If 7PM-7AM, please contact night-coverage [email or web address] Password [email or web address] 06/01/2015, 1:06 AM

## 2015-06-01 NOTE — Progress Notes (Addendum)
Brief Hospitalist Progress Note.   I have seen the patient, reviewed the H&P and confirmed history and physical exam.   Further history: Patient reports she has been having episodes like this since the 90s with acute nausea, vomiting and pain.  Pain is epigastric and burning in nature.  Her BP goes high with her pain and with missing her meds.  She also notes that since she has been lying in bed, she has been having increased back pain.  She was able to drink some cranberry juice this morning without emesis.   Plan:   N/V/Abdominal pain - Given the long term nature of her symptoms and cyclical nature, I am curious if she has ever had a GES.  This could be done in the outpatient setting once she is improved - Continue pain control with norco, morphine - Zofran for nausea - Given burning nature of pain, start IV protonix - Clear liquid diet, advance as tolerated - IVF with NS 100cc/hr today while she is not eating consistently  Back pain - Encouraged her to sit up out of bed today.   HTN - Clonidine patch, IV pushes of hydralazine - Once she is eating consistently, start PO medications and d/c patch.   4. Hyperglycemia - Check A1C, CBG 172 despite not eating.  - SSI if needed  Debe Coder, MD [phone]

## 2015-06-02 DIAGNOSIS — G43A1 Cyclical vomiting, intractable: Secondary | ICD-10-CM

## 2015-06-02 DIAGNOSIS — I10 Essential (primary) hypertension: Secondary | ICD-10-CM

## 2015-06-02 DIAGNOSIS — E860 Dehydration: Secondary | ICD-10-CM

## 2015-06-02 LAB — GLUCOSE, CAPILLARY: Glucose-Capillary: 144 mg/dL — ABNORMAL HIGH (ref 65–99)

## 2015-06-02 NOTE — Progress Notes (Signed)
Patient ID: Ana Stone, female   DOB: January 26, 1956, 60 y.o.   MRN: [id number]  [facility] PROGRESS NOTE  [name] NWG:[id number] DOB: [birth year] DOA: 05/31/2015 PCP: CM consult for assistance    Brief narrative:    60 y.o. female with PMH of depression and anxiety who was admitted to the hospital 3 weeks ago and found to have small bowel intussusception, now presenting with worsening abdominal pain, nausea, and vomiting. During the recent admission, laparoscopic exploration was performed with no significant findings per report. Patient was discharged in improved and stable condition but reports a recurrence in abdominal pain within a few days of leaving the hospital. She was discharged with plans to follow up with GI for capsule endoscopy but has not followed through with this as of yet.   Assessment/Plan:    Principal Problem:   Intractable nausea and vomiting - Uncertain etiology  - CT reveals non-specific inflammatory signs adjacent to sigmoid colon - Has been fluid-resuscitated in ED  - Advance diet as tolerated  - No infectious signs, watching off abx for now - continue to provide antiemetics as needed      Hypertensive urgency  - SBP in 90's this AM    Polycythemia  - Hgb 18.2, Hct 53.2 on admission  - H/H have been consistently elevated  - Likely a component of relative polycythemia in setting of N/V with dehydration  - now resolved   DVT prophylaxis - SCD's  Code Status: Full.  Family Communication:  plan of care discussed with the patient Disposition Plan: Home in AM if tolerating diet well   IV access:  Peripheral IV  Procedures and diagnostic studies:     Ct Abdomen Pelvis W Contrast 05/31/2015  Suggestion of mucosal edema along the sigmoid colon, with underlying diffuse diverticulosis. This could reflect a mild infectious or inflammatory process, depending on the patient's symptoms, or could remain within normal limits. 2. Heterogeneity again  noted within the endometrial canal. As recommended on recent prior MRI, pelvic ultrasound is recommended for further evaluation on an elective nonemergent basis, to exclude underlying mass. 3. Bilateral adrenal nodules again noted, measuring 2.1 cm on the right and 1.8 cm on the left. These were seen to reflect adrenal adenomas on the prior MRI. 4. Small bilateral renal cysts seen. 5. Small duodenal diverticulum at the pancreatic head. 6. 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    Dg Abd Acute W/chest 05/31/2015 Nonobstructive bowel gas pattern. 2. No pneumoperitoneum. 3. Smoothly marginated 1.3 x 1.6 cm nodule in the right mid lung may be within either the right middle lobe or right lower lobe. No prior studies are available for comparison. The possibility of a primary neoplasm or metastatic lesion should be considered, and further evaluation with contrast enhanced chest CT is recommended in the near future for further evaluation. At the time of the chest CT, attention of right paratracheal region is also recommended to evaluate for potential lymphadenopathy.   Medical Consultants:  None  Other Consultants:  None  IAnti-Infectives:   None  Debbora Presto, MD  [facility] Pager [phone]  If 7PM-7AM, please contact night-coverage [email or web address] Password [email or web address] 06/02/2015, 1:28 PM     HPI/Subjective: No events overnight.   Objective: Filed Vitals:   06/01/15 0630 06/01/15 1444 06/01/15 2120 06/02/15 0458  BP: 155/80 126/82 132/76 92/49  Pulse: 87 65 56 56  Temp: 98.8 F (37.1 C) 98 F (36.7 C) 97.8 F (36.6 C) 98.1 F (  36.7 C)  TempSrc: Oral Oral Oral Oral  Resp: Height:      Weight:      SpO2: 96% 100% 95% 98%    Intake/Output Summary (Last 24 hours) at 06/02/15 1328 Last data filed at 06/02/15 1300  Gross per 24 hour  Intake    240 ml  Output   1750 ml  Net  -1510 ml    Exam:   General:  Pt is alert, follows  commands appropriately, not in acute distress  Cardiovascular: Regular rate and rhythm, S1/S2, no murmurs, no rubs, no gallops  Respiratory: Clear to auscultation bilaterally, no wheezing, no crackles, no rhonchi  Abdomen: Soft, non tender, non distended, bowel sounds present, no guarding  Data Reviewed: Basic Metabolic Panel:  Recent Labs Lab 05/31/15 1950 06/01/15 0405  NA 141 140  K 3.7 3.4*  CL 102 106  CO2 25 23  GLUCOSE 141* 125*  BUN 13 9  CREATININE 0.61 0.47  CALCIUM 10.0 8.5*   Liver Function Tests:  Recent Labs Lab 05/31/15 1950 06/01/15 0405  AST 25 15  ALT 24 17  ALKPHOS 78 54  BILITOT 1.0 0.9  PROT 8.3* 6.0*  ALBUMIN 5.2* 3.8   CBC:  Recent Labs Lab 05/31/15 1950 06/01/15 0405  WBC 9.6 7.8  NEUTROABS 7.8* 6.0  HGB 18.2* 14.6  HCT 53.2* 44.6  MCV 93.8 93.9  PLT 369 298   CBG:  Recent Labs Lab 06/01/15 1023 06/02/15 0819  GLUCAP 135* 144*   Scheduled Meds: . cloNIDine  0.2 mg Transdermal Weekly  . hydrochlorothiazide  25 mg Oral Daily  . pantoprazole (PROTONIX) IV  40 mg Intravenous Q12H  . sertraline  150 mg Oral Daily   Continuous Infusions:

## 2015-06-03 DIAGNOSIS — E860 Dehydration: Secondary | ICD-10-CM | POA: Diagnosis not present

## 2015-06-03 DIAGNOSIS — G43A1 Cyclical vomiting, intractable: Secondary | ICD-10-CM | POA: Diagnosis not present

## 2015-06-03 LAB — BASIC METABOLIC PANEL
Anion gap: 8 (ref 5–15)
BUN: 19 mg/dL (ref 6–20)
CALCIUM: 9.6 mg/dL (ref 8.9–10.3)
CO2: 26 mmol/L (ref 22–32)
CREATININE: 0.63 mg/dL (ref 0.44–1.00)
Chloride: 104 mmol/L (ref 101–111)
GFR calc Af Amer: >60 mL/min (ref >60)
GLUCOSE: 123 mg/dL — AB (ref 65–99)
POTASSIUM: 3.1 mmol/L — AB (ref 3.5–5.1)
SODIUM: 138 mmol/L (ref 135–145)

## 2015-06-03 LAB — HEMOGLOBIN A1C
HEMOGLOBIN A1C: 5.9 % — AB (ref 4.8–5.6)
MEAN PLASMA GLUCOSE: 123 mg/dL

## 2015-06-03 LAB — GLUCOSE, CAPILLARY: Glucose-Capillary: 120 mg/dL — ABNORMAL HIGH (ref 65–99)

## 2015-06-03 LAB — CBC
HCT: 42.9 % (ref 36.0–46.0)
Hemoglobin: 14.2 g/dL (ref 12.0–15.0)
MCH: 31.1 pg (ref 26.0–34.0)
MCHC: 33.1 g/dL (ref 30.0–36.0)
MCV: 94.1 fL (ref 78.0–100.0)
PLATELETS: 246 10*3/uL (ref 150–400)
RBC: 4.56 MIL/uL (ref 3.87–5.11)
RDW: 13.7 % (ref 11.5–15.5)
WBC: 7.1 10*3/uL (ref 4.0–10.5)

## 2015-06-03 MED ORDER — ONDANSETRON HCL 4 MG PO TABS: 4.0000 mg | ORAL_TABLET | Freq: Four times a day (QID) | ORAL | Status: AC

## 2015-06-03 MED ORDER — OXYCODONE-ACETAMINOPHEN 5-325 MG PO TABS: 1.0000 | ORAL_TABLET | ORAL | Status: AC | PRN

## 2015-06-03 MED ORDER — PANTOPRAZOLE SODIUM 40 MG PO TBEC
40.0000 mg | DELAYED_RELEASE_TABLET | Freq: Two times a day (BID) | ORAL | Status: DC
  Administered 2015-06-03: 40 mg via ORAL
  Filled 2015-06-03: qty 1

## 2015-06-03 MED ORDER — PANTOPRAZOLE SODIUM 40 MG PO TBEC: 40.0000 mg | DELAYED_RELEASE_TABLET | Freq: Every day | ORAL | Status: AC

## 2015-06-03 MED ORDER — POTASSIUM CHLORIDE CRYS ER 20 MEQ PO TBCR
40.0000 meq | EXTENDED_RELEASE_TABLET | Freq: Once | ORAL | Status: AC
  Administered 2015-06-03: 40 meq via ORAL
  Filled 2015-06-03: qty 2

## 2015-06-03 NOTE — Discharge Summary (Signed)
Physician Discharge Summary  Ana Stone ZOX:[id number] DOB: 08-May-1956 DOA: 05/31/2015  PCP: No primary care provider on file.  Admit date: 05/31/2015 Discharge date: 06/03/2015  Recommendations for Outpatient Follow-up:  1. Pt will need to follow up with PCP in 2-3 weeks post discharge 2. Please obtain BMP to evaluate electrolytes and kidney function  Discharge Diagnoses:  Principal Problem:   Intractable nausea and vomiting Active Problems:   Essential hypertension   Dehydration   Polycythemia   Generalized abdominal pain  Discharge Condition: Stable  Diet recommendation: Heart healthy diet discussed in details   History of present illness:  60 y.o. female with PMH of depression and anxiety who was admitted to the hospital 3 weeks ago and found to have small bowel intussusception, now presented with worsening abdominal pain, nausea, and vomiting. During the recent admission, laparoscopic exploration was performed with no significant findings per report. Patient was discharged in improved and stable condition but reports a recurrence in abdominal pain within a few days of leaving the hospital. She was discharged with plans to follow up with GI for capsule endoscopy but has not followed through with this as of yet.   In ED, patient was found to be afebrile, saturating well on room air, and hypertensive to the 200/110 range. Initial blood work was largely unremarkable and a CT of the abdomen and pelvis demonstrates mild inflammatory signs at the sigmoid colon felt to represent an infectious or inflammatory process, or could even be within normal limits per radiology. Patient was treated with antibiotics and IV fluids in the emergency department but discharge home was precluded by intractable nausea and vomiting. She will be admitted to the hospital for management of intractable nausea and vomiting and further evaluation as to its etiology.  Hospital Course:  Principal Problem:    Intractable nausea and vomiting - diet advanced and pt tolerated well, symptoms resolved - allow antiemetics as needed   Active Problems:   Essential hypertension - continue HCTZ     Dehydration - resolved with IVF    Hypokalemia - from vomiting - supplemented prior to discharge     Polycythemia - resolved   Procedures/Studies: Ct Abdomen Pelvis W Contrast 05/31/2015  Suggestion of mucosal edema along the sigmoid colon, with underlying diffuse diverticulosis. This could reflect a mild infectious or inflammatory process, depending on the patient's symptoms, or could remain within normal limits. 2. Heterogeneity again noted within the endometrial canal. As recommended on recent prior MRI, pelvic ultrasound is recommended for further evaluation on an elective nonemergent basis, to exclude underlying mass. 3. Bilateral adrenal nodules again noted, measuring 2.1 cm on the right and 1.8 cm on the left. These were seen to reflect adrenal adenomas on the prior MRI. 4. Small bilateral renal cysts seen.    Discharge Exam: Filed Vitals:   06/02/15 2102 06/03/15 0620  BP: 85/46 91/57  Pulse: 57 60  Temp: 98.6 F (37 C) 98.7 F (37.1 C)  Resp: 18 18   Filed Vitals:   06/02/15 0458 06/02/15 1400 06/02/15 2102 06/03/15 0620  BP: 92/49 105/66 85/46 91/57   Pulse: 56 55 57 60  Temp: 98.1 F (36.7 C) 97.6 F (36.4 C) 98.6 F (37 C) 98.7 F (37.1 C)  TempSrc: Oral Oral Oral Oral  Resp: Height:      Weight:      SpO2: 98% 100% 95% 95%    General: Pt is alert, follows commands appropriately, not in acute distress Cardiovascular: Regular  rate and rhythm, S1/S2 +, no murmurs, no rubs, no gallops Respiratory: Clear to auscultation bilaterally, no wheezing, no crackles, no rhonchi Abdominal: Soft, non tender, non distended, bowel sounds +, no guarding Extremities: no edema, no cyanosis, pulses palpable bilaterally DP and PT Neuro: Grossly nonfocal  Discharge  Instructions  Discharge Instructions    Diet - low sodium heart healthy    Complete by:  As directed      Increase activity slowly    Complete by:  As directed             Medication List    TAKE these medications        ALPRAZolam 1 MG tablet  Commonly known as:  XANAX  Take 1 mg by mouth 3 (three) times daily as needed for anxiety.     famotidine 20 MG tablet  Commonly known as:  PEPCID  Take 1 tablet (20 mg total) by mouth 2 (two) times daily.     hydrochlorothiazide 25 MG tablet  Commonly known as:  HYDRODIURIL  Take 25 mg by mouth daily.     ondansetron 4 MG tablet  Commonly known as:  ZOFRAN  Take 1 tablet (4 mg total) by mouth every 6 (six) hours.     oxyCODONE-acetaminophen 5-325 MG tablet  Commonly known as:  PERCOCET/ROXICET  Take 1-2 tablets by mouth every 4 (four) hours as needed for moderate pain or severe pain.     pantoprazole 40 MG tablet  Commonly known as:  PROTONIX  Take 1 tablet (40 mg total) by mouth daily.     promethazine 25 MG tablet  Commonly known as:  PHENERGAN  Take 25 mg by mouth every 6 (six) hours as needed for nausea or vomiting.     sertraline 100 MG tablet  Commonly known as:  ZOLOFT  Take 150 mg by mouth daily.     tetrahydrozoline 0.05 % ophthalmic solution  Place 1 drop into both eyes 2 (two) times daily as needed. For dry eyes     Vitamin D (Ergocalciferol) 50000 units Caps capsule  Commonly known as:  DRISDOL  Take 50,000 Units by mouth every 7 (seven) days.            Follow-up Information    Schedule an appointment as soon as possible for a visit with [facility].   Specialty:  Gastroenterology   Contact information:   [address] [place] [address] [phone]       The results of significant diagnostics from this hospitalization (including imaging, microbiology, ancillary and laboratory) are listed below for reference.     Microbiology: No results found for this  or any previous visit (from the past 240 hour(s)).   Labs: Basic Metabolic Panel:  Recent Labs Lab 05/31/15 1950 06/01/15 0405 06/03/15 0520  NA 141 140 138  K 3.7 3.4* 3.1*  CL 102 106 104  CO2 GLUCOSE 141* 125* 123*  BUN CREATININE 0.61 0.47 0.63  CALCIUM 10.0 8.5* 9.6   Liver Function Tests:  Recent Labs Lab 05/31/15 1950 06/01/15 0405  AST 25 15  ALT 24 17  ALKPHOS 78 54  BILITOT 1.0 0.9  PROT 8.3* 6.0*  ALBUMIN 5.2* 3.8   CBC:  Recent Labs Lab 05/31/15 1950 06/01/15 0405 06/03/15 0520  WBC 9.6 7.8 7.1  NEUTROABS 7.8* 6.0  --   HGB 18.2* 14.6 14.2  HCT 53.2* 44.6 42.9  MCV 93.8 93.9 94.1  PLT 369 298 246    CBG:  Recent Labs Lab 06/01/15 1023 06/02/15 0819 06/03/15 0754  GLUCAP 135* 144* 120*     SIGNED: Time coordinating discharge:  30 minutes  MAGICK-Deniro Laymon, MD  [facility] 06/03/2015, 9:37 AM Pager [phone]  If 7PM-7AM, please contact night-coverage [email or web address] Password [email or web address]

## 2015-06-03 NOTE — Progress Notes (Signed)
This patient is receiving IV Protonix. Based on criteria approved by the Pharmacy and Therapeutics Committee, this medication is being converted to the equivalent oral dose form. These criteria include: . The patient is eating (either orally or per tube) and/or has been taking other orally administered medications for at least 24 hours. . This patient has no evidence of active gastrointestinal bleeding or impaired GI absorption (gastrectomy, short bowel, patient on TNA or NPO).  If you have questions about this conversion, please contact the pharmacy department. Thank you.  Clance Boll, PharmD, BCPS Pager: [phone] 06/03/2015 9:04 AM

## 2015-06-03 NOTE — Progress Notes (Signed)
Assessment unchanged. Pt verbalized understanding of dc instructions through teach back regarding when to call the doctor and follow up appts. Script x 2 given as provided by MD. Discharged via foot per pt request to meet awaiting vehicle to carry home. Accompanied by son and NT.

## 2015-06-03 NOTE — Care Management Note (Addendum)
Case Management Note  Patient Details  Name: Caiden Monsivais MRN: [id number] Date of Birth: [birth year]  Subjective/Objective:    Admitted with intractable nausea and vomiting                Action/Plan: Discharge planning, no HH needs identified  Expected Discharge Date:                  Expected Discharge Plan:  Home/Self Care  In-House Referral:  NA  Discharge planning Services  CM Consult  Post Acute Care Choice:  NA Choice offered to:  NA  DME Arranged:  N/A DME Agency:  NA  HH Arranged:  NA HH Agency:  NA  Status of Service:  Completed, signed off  Medicare Important Message Given:    Date Medicare IM Given:    Medicare IM give by:    Date Additional Medicare IM Given:    Additional Medicare Important Message give by:     If discussed at Long Length of Stay Meetings, dates discussed:    Additional Comments:  Alexis Goodell, RN 06/03/2015, 11:45 AM [phone]

## 2016-03-17 IMAGING — CT CT ABD-PELV W/ CM
1 of 4 series · 13 of 32 positions shown, 18 images · IV contrast (omnipaque)
Comparison: None.

CLINICAL DATA: 59-year-old female with nausea vomiting and
periumbilical pain.

EXAM:
CT ABDOMEN AND PELVIS WITH CONTRAST
TECHNIQUE: Multidetector CT imaging of the abdomen and pelvis was performed
using the standard protocol following bolus administration of
intravenous contrast.
CONTRAST:  25mL OMNIPAQUE IOHEXOL 300 MG/ML SOLN, 100mL OMNIPAQUE
IOHEXOL 300 MG/ML SOLN

[Series 2: abd/ pelvis · axial · 0.96mm/px · z∈[-496,-146]mm · 13 of 81 slices shown, 18 images]
[im 6/81  soft-tissue]
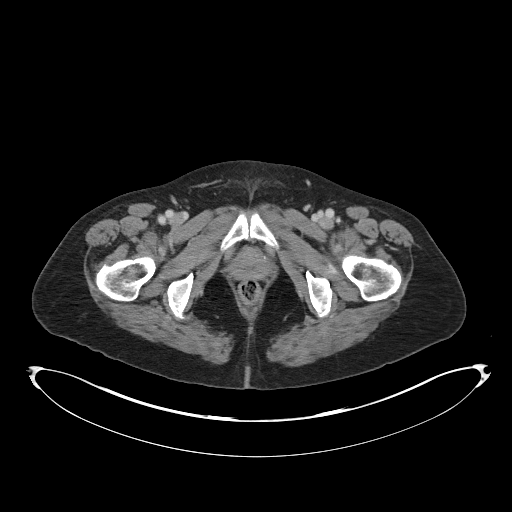
[im 6/81  bone]
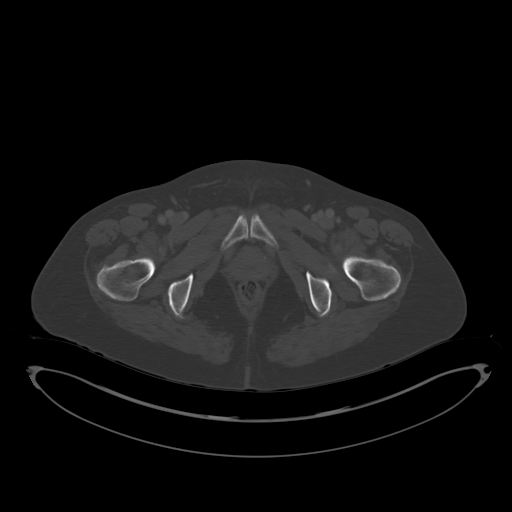
[im 11/81  soft-tissue]
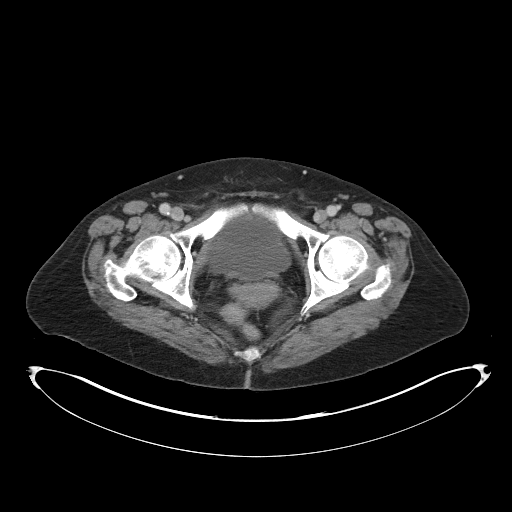
[im 21/81  soft-tissue]
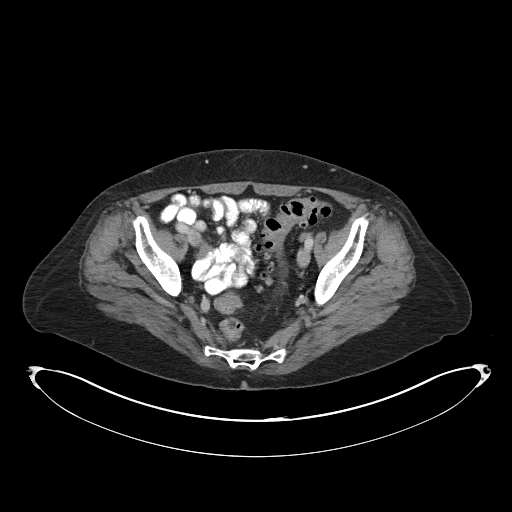
[im 26/81  soft-tissue]
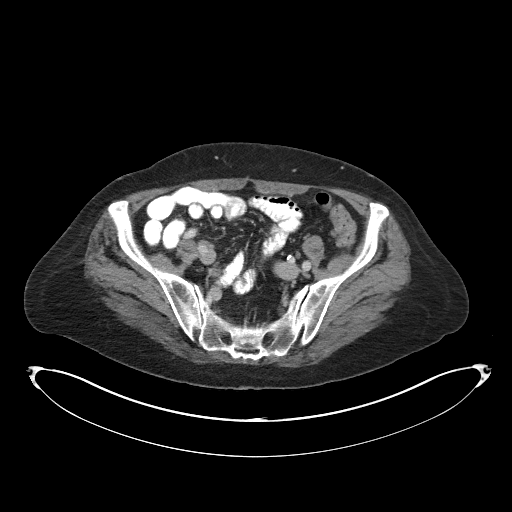
[im 31/81  soft-tissue]
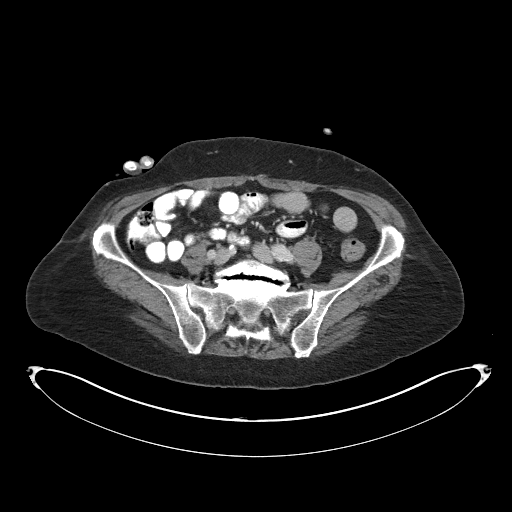
[im 36/81  soft-tissue]
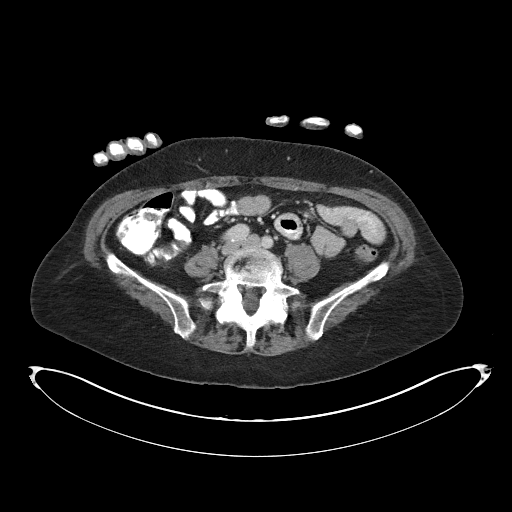
[im 46/81  soft-tissue]
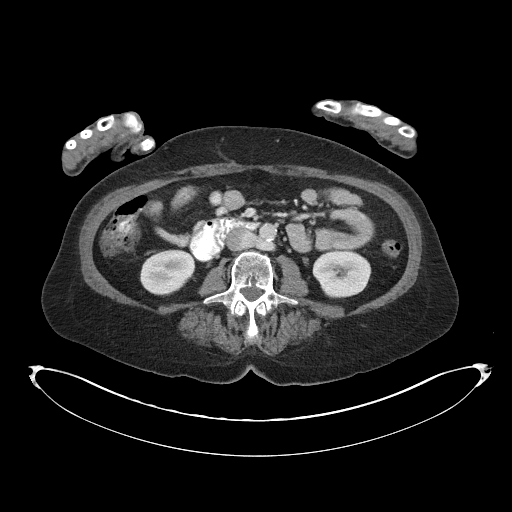
[im 51/81  soft-tissue]
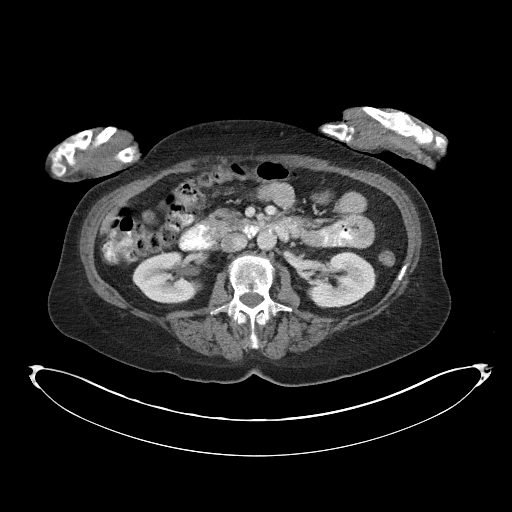
[im 56/81  soft-tissue]
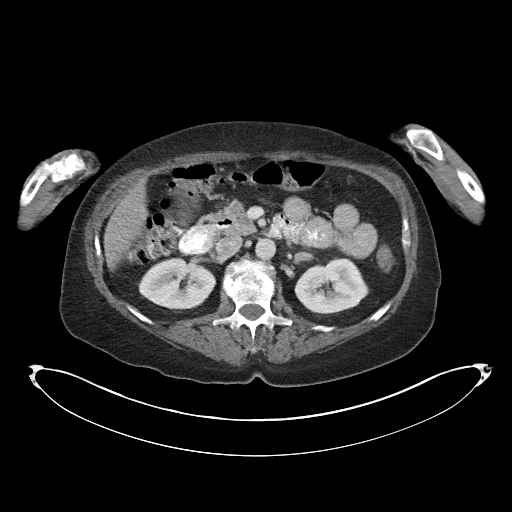
[im 56/81  bone]
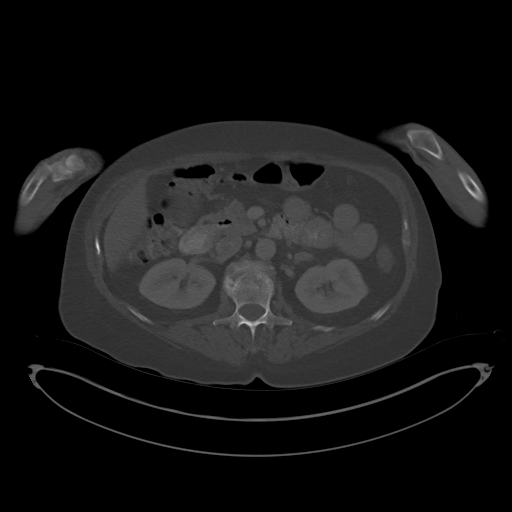
[im 61/81  soft-tissue]
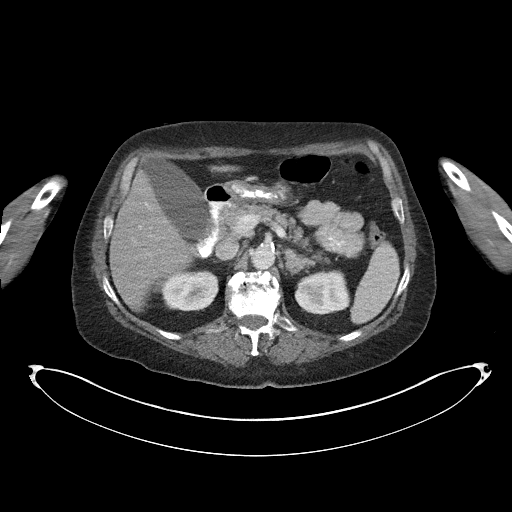
[im 61/81  lung]
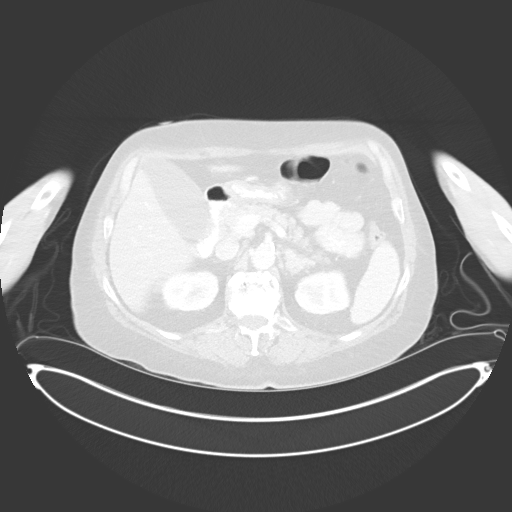
[im 66/81  lung]
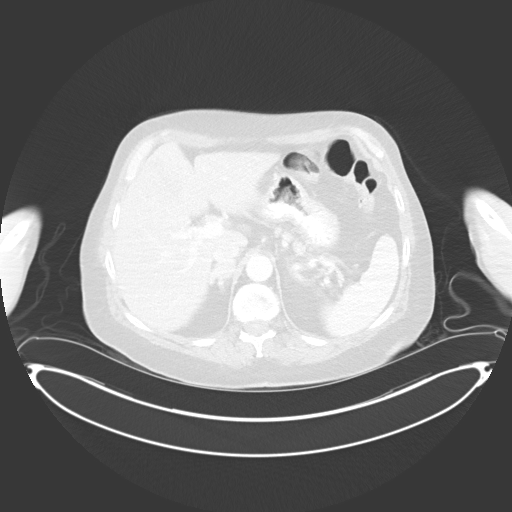
[im 71/81  soft-tissue]
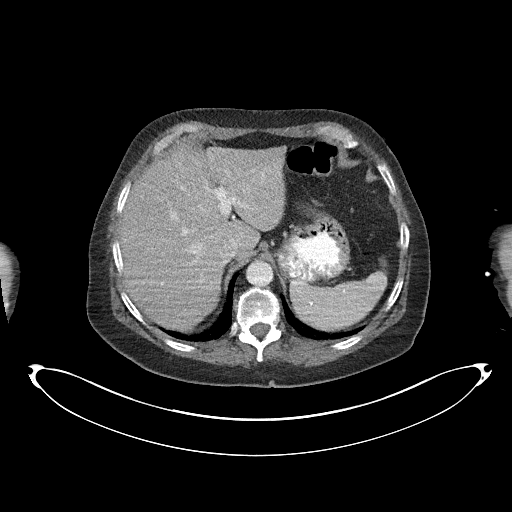
[im 71/81  lung]
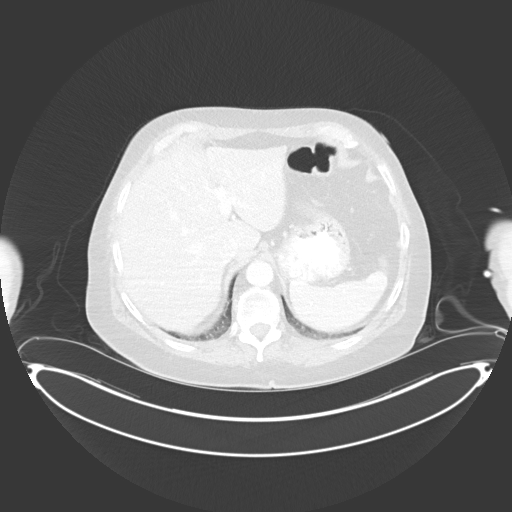
[im 76/81  soft-tissue]
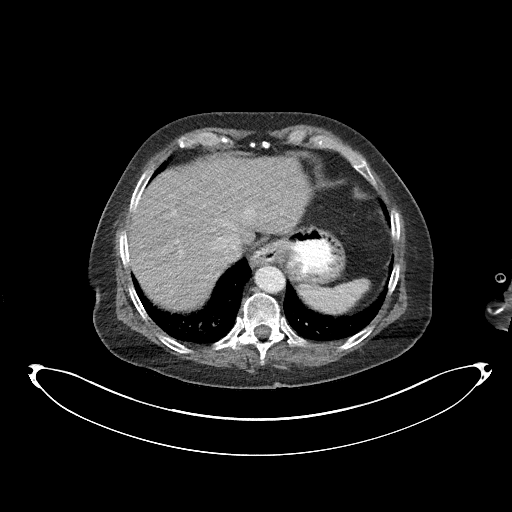
[im 76/81  lung]
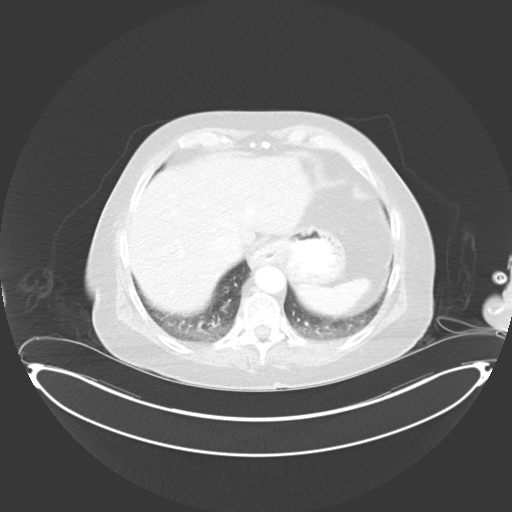

[13 of 32 positions shown; findings below may reference images not displayed]

FINDINGS: Minimal bibasilar dependent atelectatic changes. The lung bases are
otherwise clear. No intra-abdominal free air or free fluid.

The liver, gallbladder, pancreas, and spleen appear unremarkable.
There are bilateral adrenal thickening and nodularity measuring up
to 1.6 cm on the right. MRI may provide better evaluation. 1 cm
bilateral renal hypodense lesions are incompletely characterized but
likely represent. There is no hydronephrosis on either side. The
visualized ureters and urinary bladder appear unremarkable. The
uterus and the ovaries appear grossly unremarkable.

There is approximately cm telescoping of a loop of small bowel
within the adjacent small bowel loop in the left lower abdomen
compatible with intussusception. Although this may be a transient
intussusception the length of the intussusception is concerning for
an underlying lead point. Clinical correlation and follow-up with MR
enterography recommended. There is mild thickening and edema of the
bowel at the intussusception. No pneumatosis identified. There is
extensive sigmoid diverticulosis with muscular hypertrophy. Minimal
perisigmoid haziness likely represents chronic inflammation and
scarring. A mild/early sigmoid diverticulitis is less likely but not
excluded. Clinical correlation is recommended. There is no evidence
of bowel obstruction. Normal appendix. A small hiatal hernia is
noted. There is a 2.7 cm duodenal diverticulum.

There is mild aortoiliac atherosclerotic disease. The abdominal
aorta and IVC appear patent. The origins of the celiac axis, SMA,
IMA as well as the origins of the renal arteries are patent. There
is a retro aortic left renal vein. No portal venous gas identified.
There is no adenopathy.

The abdominal wall soft tissues appear unremarkable. There is
degenerative changes of the spine. Bilateral L5 pars defects with
grade 1 L5-S1 anterolisthesis. No acute fracture.
IMPRESSION: A 6 cm small bowel intussusception in the left hemiabdomen with mild
edema of the bowel. No pneumatosis or evidence of obstruction.
Follow-up with MR enterography recommended to exclude underlying
lead point.

Extensive sigmoid diverticulosis with muscular hypertrophy. A
mild/early diverticulitis is less likely. Clinical correlation is
recommended. No evidence of bowel obstruction. Normal appendix.

## 2016-04-06 IMAGING — CR DG ABDOMEN ACUTE W/ 1V CHEST
3 series · 3 of 3 positions shown · non-contrast
Comparison: No priors.

CLINICAL DATA: 59-year-old female with diffuse abdominal pain and
emesis for 2 weeks. Laparoscopy on 05/14/2015.

EXAM:
DG ABDOMEN ACUTE W/ 1V CHEST

[w chest pa]
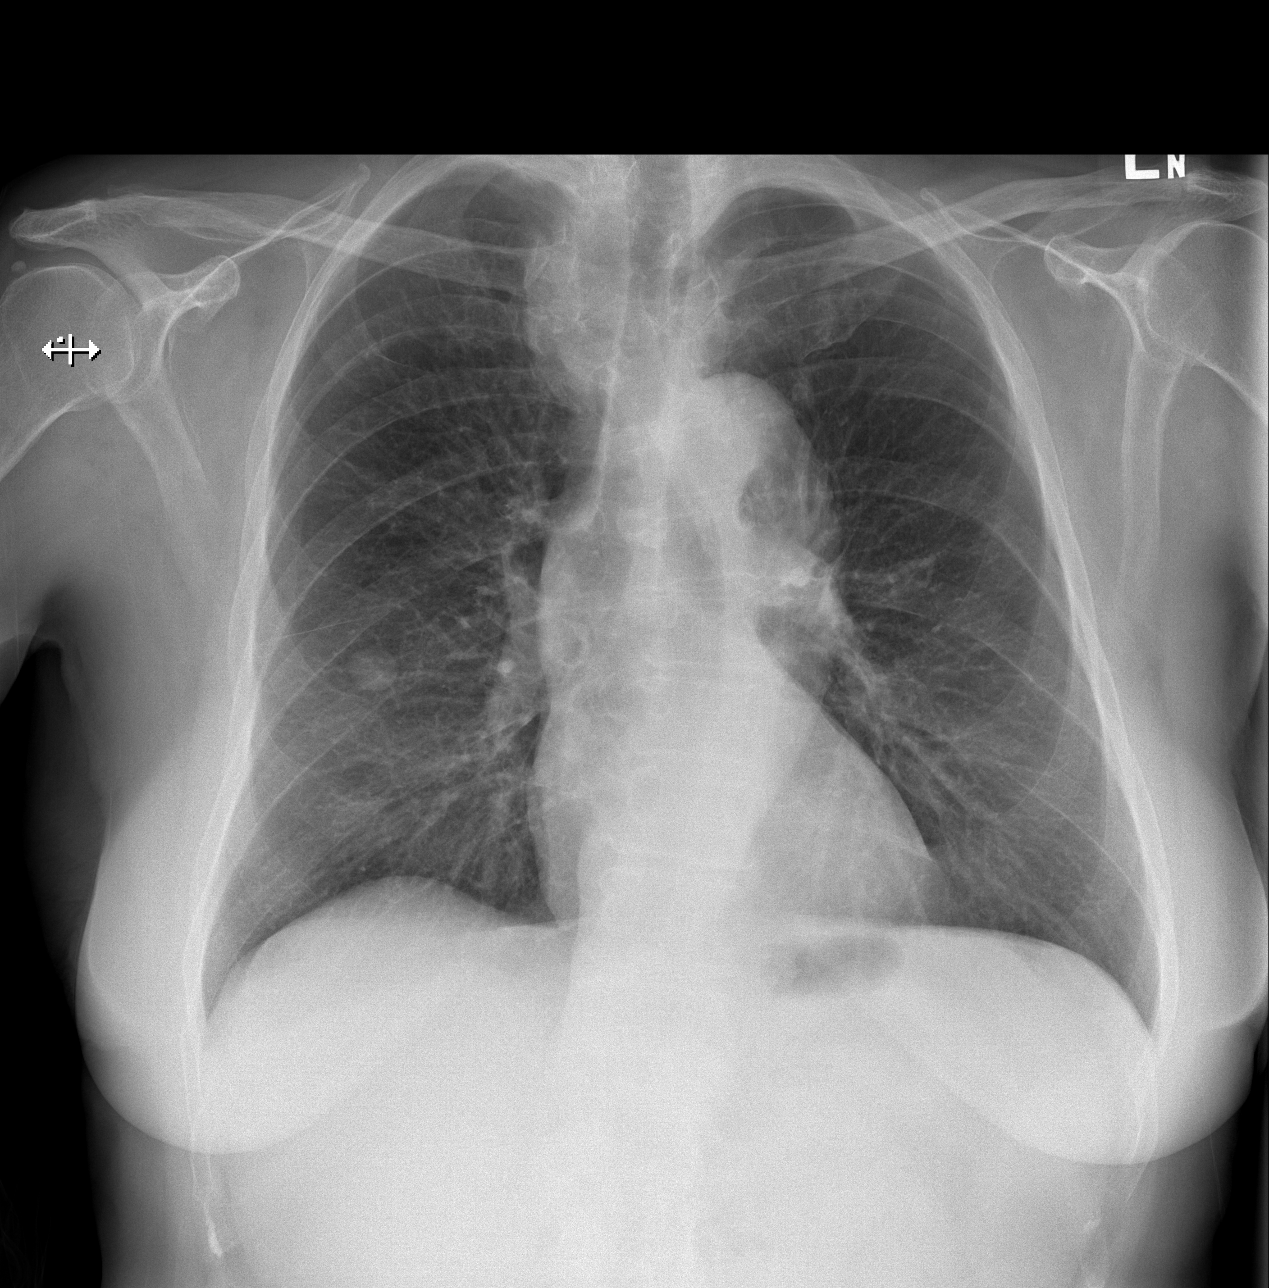

[w abdomen upright]
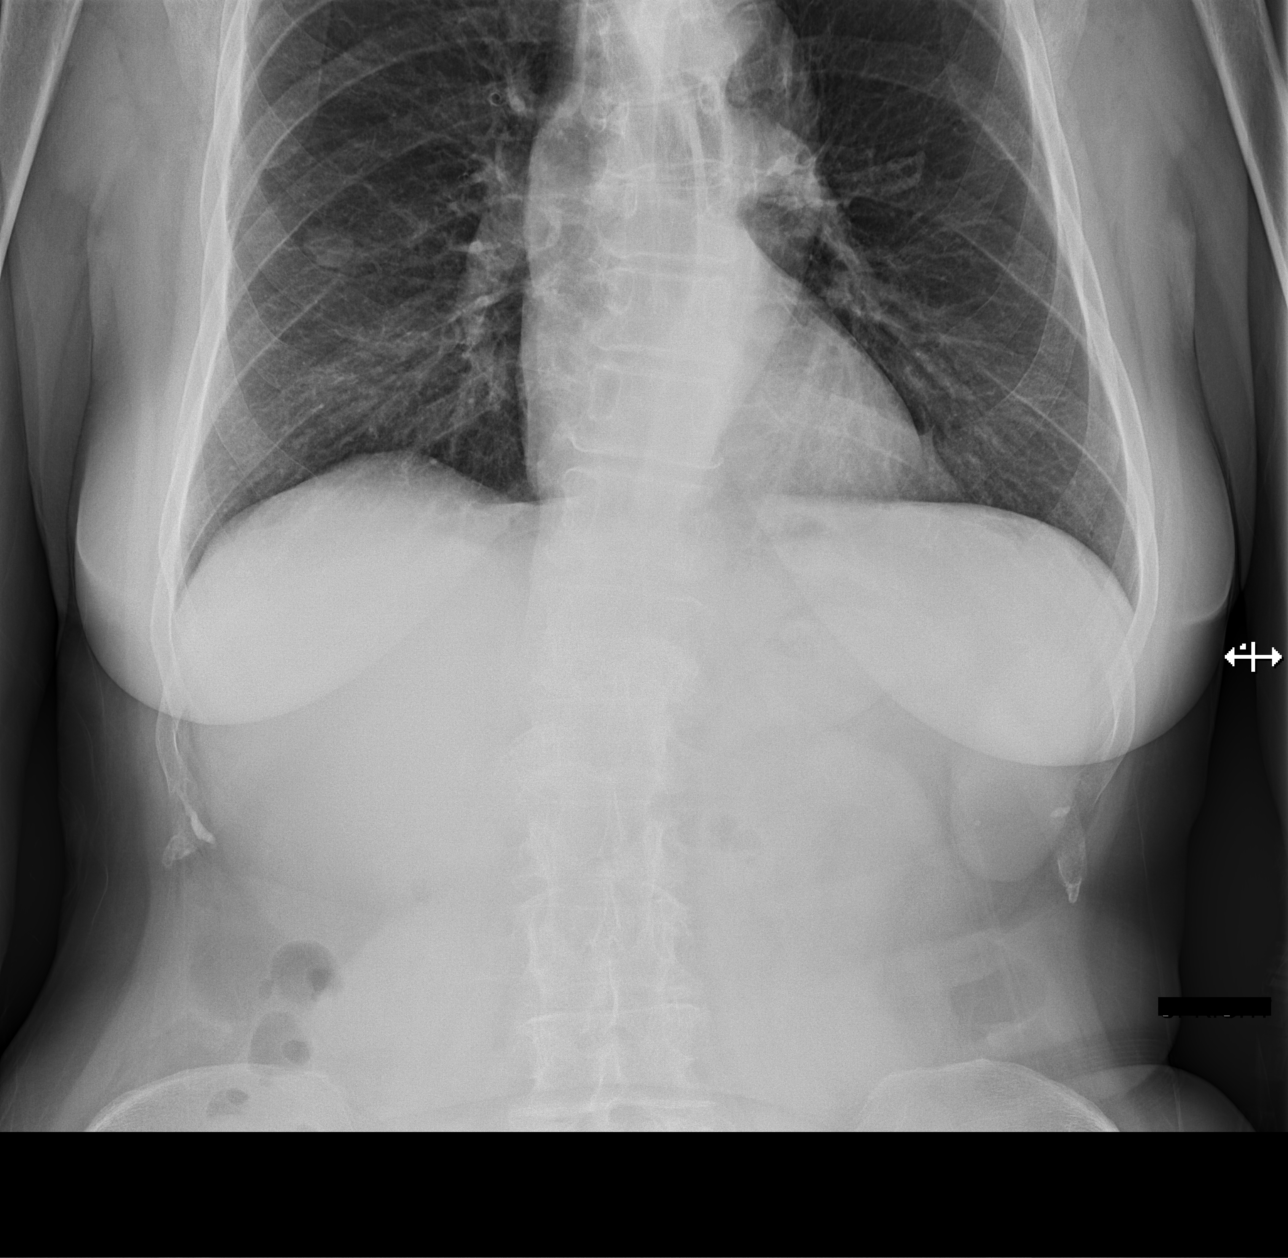

[t abdomen supine]
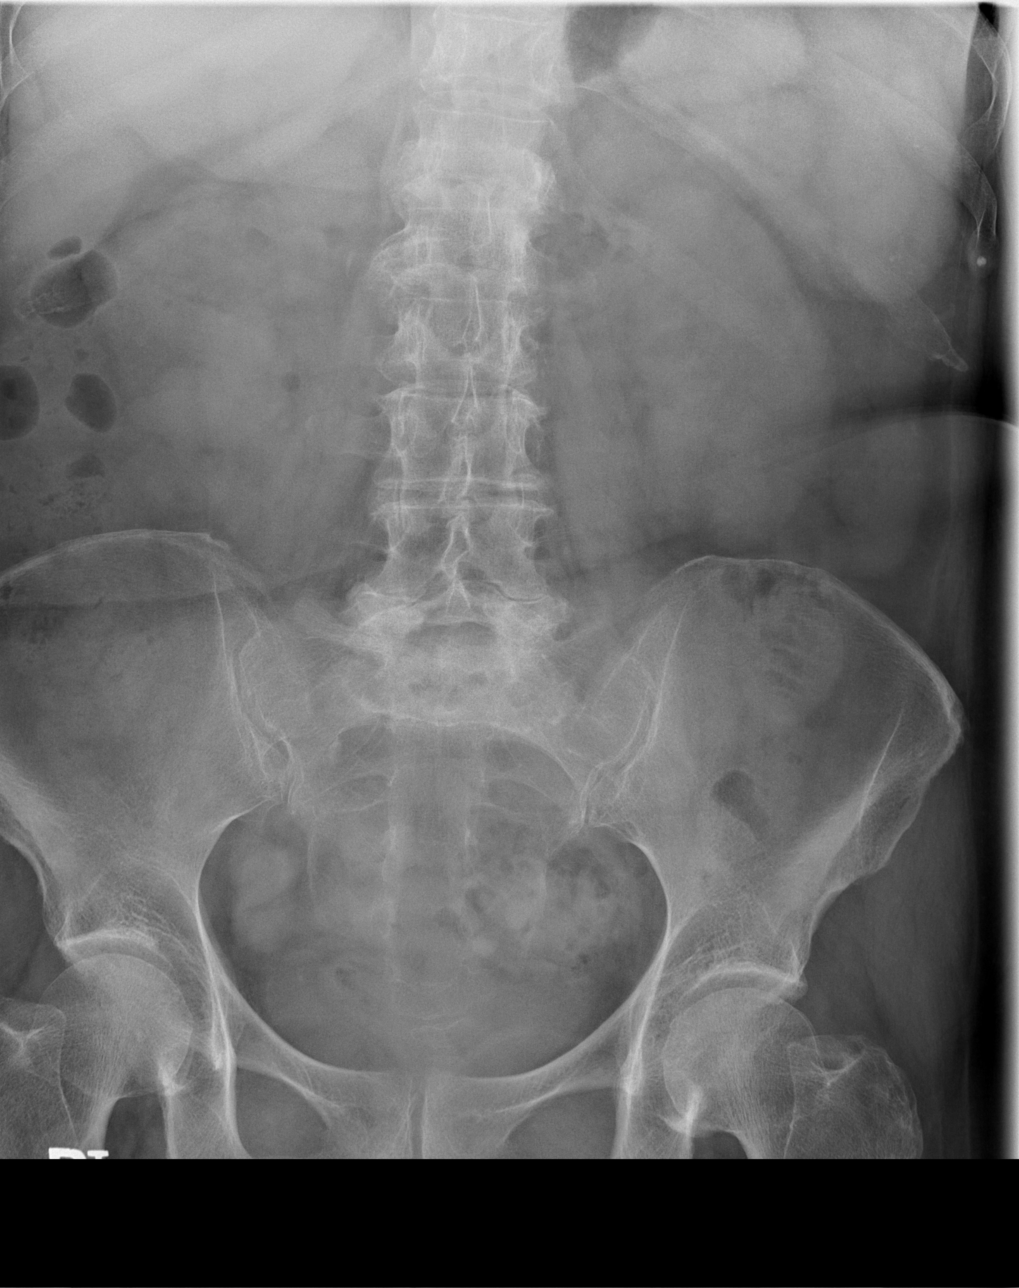

[3 of 3 positions shown; findings below may reference images not displayed]

FINDINGS: Well circumscribed 13 x 16 mm nodule projecting over the right mid
lung just beneath the minor fissure. No acute consolidative airspace
disease. No pleural effusions. No pneumothorax. No evidence of
pulmonary edema. Heart size is normal. Atherosclerosis in the
thoracic aorta. Upper mediastinal contours are remarkable for
prominence of the right paratracheal soft tissues which may be
vascular, however, underlying adenopathy is not excluded.
Alternatively, this could be related to enlarged right lobe of the
thyroid gland.

Gas and stool are seen scattered throughout the colon extending to
the level of the distal rectum. No pathologic distension of small
bowel is noted. No gross evidence of pneumoperitoneum.
IMPRESSION: 1.  Nonobstructive bowel gas pattern.
2. No pneumoperitoneum.
3. Smoothly marginated 1.3 x 1.6 cm nodule in the right mid lung may
be within either the right middle lobe or right lower lobe. No prior
studies are available for comparison. The possibility of a primary
neoplasm or metastatic lesion should be considered, and further
evaluation with contrast enhanced chest CT is recommended in the
near future for further evaluation. At the time of the chest CT,
attention of right paratracheal region is also recommended to
evaluate for potential lymphadenopathy.
